# Patient Record
Sex: Female | Born: 1985 | Race: Black or African American | Hispanic: No | Marital: Single | Smoking: Never smoker
Health system: Southern US, Community
[De-identification: ages and names within clinical notes are randomized; demographics above are authoritative.]

## PROBLEM LIST (undated history)

## (undated) DIAGNOSIS — E119 Type 2 diabetes mellitus without complications: Secondary | ICD-10-CM

## (undated) DIAGNOSIS — K219 Gastro-esophageal reflux disease without esophagitis: Secondary | ICD-10-CM

## (undated) DIAGNOSIS — E7800 Pure hypercholesterolemia, unspecified: Secondary | ICD-10-CM

## (undated) DIAGNOSIS — D649 Anemia, unspecified: Secondary | ICD-10-CM

## (undated) HISTORY — PX: LAPAROSCOPY: SHX197

---

## 2009-04-05 ENCOUNTER — Emergency Department (HOSPITAL_BASED_OUTPATIENT_CLINIC_OR_DEPARTMENT_OTHER): Admission: EM | Admit: 2009-04-05 | Discharge: 2009-04-05 | Payer: Self-pay | Admitting: Emergency Medicine

## 2010-02-24 ENCOUNTER — Emergency Department (HOSPITAL_BASED_OUTPATIENT_CLINIC_OR_DEPARTMENT_OTHER): Admission: EM | Admit: 2010-02-24 | Discharge: 2010-02-24 | Payer: Self-pay | Admitting: Emergency Medicine

## 2010-05-02 ENCOUNTER — Emergency Department (HOSPITAL_BASED_OUTPATIENT_CLINIC_OR_DEPARTMENT_OTHER): Admission: EM | Admit: 2010-05-02 | Discharge: 2010-05-02 | Payer: Self-pay | Admitting: Emergency Medicine

## 2011-03-22 LAB — RAPID STREP SCREEN (MED CTR MEBANE ONLY): Streptococcus, Group A Screen (Direct): NEGATIVE

## 2012-11-30 ENCOUNTER — Emergency Department (HOSPITAL_BASED_OUTPATIENT_CLINIC_OR_DEPARTMENT_OTHER)
Admission: EM | Admit: 2012-11-30 | Discharge: 2012-11-30 | Disposition: A | Discharge status: Home / Self Care | Payer: Medicaid Other | Attending: Emergency Medicine | Admitting: Emergency Medicine

## 2012-11-30 ENCOUNTER — Encounter (HOSPITAL_BASED_OUTPATIENT_CLINIC_OR_DEPARTMENT_OTHER): Payer: Self-pay | Admitting: Emergency Medicine

## 2012-11-30 DIAGNOSIS — R059 Cough, unspecified: Secondary | ICD-10-CM | POA: Insufficient documentation

## 2012-11-30 DIAGNOSIS — IMO0001 Reserved for inherently not codable concepts without codable children: Secondary | ICD-10-CM | POA: Insufficient documentation

## 2012-11-30 DIAGNOSIS — J988 Other specified respiratory disorders: Secondary | ICD-10-CM

## 2012-11-30 DIAGNOSIS — R6889 Other general symptoms and signs: Secondary | ICD-10-CM | POA: Insufficient documentation

## 2012-11-30 DIAGNOSIS — R509 Fever, unspecified: Secondary | ICD-10-CM | POA: Insufficient documentation

## 2012-11-30 DIAGNOSIS — R05 Cough: Secondary | ICD-10-CM | POA: Insufficient documentation

## 2012-11-30 DIAGNOSIS — B9789 Other viral agents as the cause of diseases classified elsewhere: Secondary | ICD-10-CM | POA: Insufficient documentation

## 2012-11-30 NOTE — ED Notes (Signed)
Runny nose, nasal and chest congestion, cough, fever, headache, chills, x one week.    No N/V/D.  No dysuria.

## 2012-11-30 NOTE — ED Notes (Signed)
The patient is undressed and in a gown. The bed is locked and in the lowest position. The call light is within reach, and family is at the bedside. A warm blanket has been given.

## 2012-11-30 NOTE — ED Provider Notes (Signed)
History     CSN: [id number]  Arrival date & time 11/30/12  1031   First MD Initiated Contact with Patient 11/30/12 1136      Chief Complaint  Patient presents with  . Nasal Congestion  . Cough  . Fever    (Consider location/radiation/quality/duration/timing/severity/associated sxs/prior treatment) HPI Complains of cough, sneeze, nasal congestion and diffuse myalgias for 3 days. Admits to mild headache. Treated with Tylenol and over-the-counter cold and sinus medicine without relief presently she feels mildly achy however states "I don't feel that bad" nothing makes symptoms better or worse no other associated symptoms. History reviewed. No pertinent past medical history. Past medical history negative History reviewed. No pertinent past surgical history. 6 History reviewed. No pertinent family history.  History  Substance Use Topics  . Smoking status: Not on file  . Smokeless tobacco: Not on file  . Alcohol Use: Not on file    OB History    Grav Para Term Preterm Abortions TAB SAB Ect Mult Living                  Review of Systems  Constitutional: Positive for fever and chills.  HENT: Positive for congestion and sneezing.   Respiratory: Positive for cough.   Musculoskeletal: Positive for myalgias.  All other systems reviewed and are negative.    Allergies  Review of patient's allergies indicates no known allergies.  Home Medications  No current outpatient prescriptions on file.  BP 132/72  Pulse 85  Temp 98.7 F (37.1 C) (Oral)  Resp 16  SpO2 100%  LMP 11/04/2012  Physical Exam  Nursing note and vitals reviewed. Constitutional: She appears well-developed and well-nourished.  HENT:  Head: Normocephalic and atraumatic.  Mouth/Throat: Oropharynx is clear and moist.       Dentition poor  Eyes: Conjunctivae normal are normal. Pupils are equal, round, and reactive to light.  Neck: Neck supple. No tracheal deviation present. No thyromegaly present.   Cardiovascular: Normal rate and regular rhythm.   No murmur heard. Pulmonary/Chest: Effort normal and breath sounds normal.  Abdominal: Soft. Bowel sounds are normal. She exhibits no distension. There is no tenderness.       Obese  Musculoskeletal: Normal range of motion. She exhibits no edema and no tenderness.  Neurological: She is alert. Coordination normal.  Skin: Skin is warm and dry. No rash noted.  Psychiatric: She has a normal mood and affect.    ED Course  Procedures (including critical care time)  Labs Reviewed - No data to display No results found.   No diagnosis found.    MDM  Symptoms and exam consistent with viral respiratory illness Plan Tylenol for aches or fever Encourage oral hydration To see PMD if not better in a week Diagnosis viral respiratory illness        Doug Sou, MD 11/30/12 1200

## 2012-12-16 ENCOUNTER — Emergency Department (HOSPITAL_BASED_OUTPATIENT_CLINIC_OR_DEPARTMENT_OTHER)
Admission: EM | Admit: 2012-12-16 | Discharge: 2012-12-17 | Disposition: A | Discharge status: Home / Self Care | Payer: Medicaid Other | Attending: Emergency Medicine | Admitting: Emergency Medicine

## 2012-12-16 ENCOUNTER — Encounter (HOSPITAL_BASED_OUTPATIENT_CLINIC_OR_DEPARTMENT_OTHER): Payer: Self-pay | Admitting: *Deleted

## 2012-12-16 DIAGNOSIS — R220 Localized swelling, mass and lump, head: Secondary | ICD-10-CM | POA: Insufficient documentation

## 2012-12-16 DIAGNOSIS — K089 Disorder of teeth and supporting structures, unspecified: Secondary | ICD-10-CM | POA: Insufficient documentation

## 2012-12-16 DIAGNOSIS — Z3202 Encounter for pregnancy test, result negative: Secondary | ICD-10-CM | POA: Insufficient documentation

## 2012-12-16 DIAGNOSIS — IMO0002 Reserved for concepts with insufficient information to code with codable children: Secondary | ICD-10-CM | POA: Insufficient documentation

## 2012-12-16 DIAGNOSIS — K0889 Other specified disorders of teeth and supporting structures: Secondary | ICD-10-CM

## 2012-12-16 DIAGNOSIS — M545 Low back pain: Secondary | ICD-10-CM

## 2012-12-16 LAB — URINALYSIS, ROUTINE W REFLEX MICROSCOPIC
Bilirubin Urine: NEGATIVE
Glucose, UA: NEGATIVE mg/dL
Ketones, ur: NEGATIVE mg/dL
Protein, ur: NEGATIVE mg/dL

## 2012-12-16 LAB — URINE MICROSCOPIC-ADD ON

## 2012-12-16 NOTE — ED Notes (Signed)
States she has had a dental abscess x 6 months.

## 2012-12-16 NOTE — ED Notes (Signed)
Pt also c/o dental pain R Lower molar area pain 7/10

## 2012-12-16 NOTE — ED Notes (Signed)
Pt requesting to be checked for uti, states that she is having lower back pain for several days

## 2012-12-16 NOTE — ED Provider Notes (Signed)
History   This chart was scribed for Hurman Horn, MD by Leone Payor, ED Scribe. This patient was seen in room MHOTF/OTF and the patient's care was started at 2243.   CSN: [id number]  Arrival date & time 12/16/12  2144   First MD Initiated Contact with Patient 12/16/12 2243      Chief Complaint  Patient presents with  . Dental Problem     The history is provided by the patient. No language interpreter was used.    Selena Boyd is a 27 y.o. female who presents to the Emergency Department complaining of constant, gradually worsening, moderate, chronic dental abscess with associated starting 6 months ago. Pt reports sometimes taking tylenol with mild relief but she has not taken any OTC medication today.  Pt denies seeing a dentist in "years".   Pt also complains of having constant, unchanged, moderate lower back pain that radiates down both legs below the knee for 1 week. States pain is worse with movement and urinating and is relieved with rest. She denies fever, weakness or numbness in extremities, rash, change in bowel or bladder function, IV drug use, vaginal discharge, abdominal pain, nausea, vomiting, diarrhea   Pt denies smoking and alcohol use.  History reviewed. No pertinent past medical history.  History reviewed. No pertinent past surgical history.  No family history on file.  History  Substance Use Topics  . Smoking status: Never Smoker   . Smokeless tobacco: Not on file  . Alcohol Use: No    No OB history provided.   Review of Systems  10 Systems reviewed and all are negative for acute change except as noted in the HPI.    Allergies  Review of patient's allergies indicates no known allergies.  Home Medications   Current Outpatient Rx  Name  Route  Sig  Dispense  Refill  . HYDROCODONE-ACETAMINOPHEN 5-325 MG PO TABS   Oral   Take 2 tablets by mouth every 6 (six) hours as needed for pain.   20 tablet   0   . PENICILLIN V POTASSIUM 500 MG PO TABS  Oral   Take 2 tablets (1,000 mg total) by mouth 2 (two) times daily. X 7 days   28 tablet   0     BP 156/95  Pulse 106  Temp 98.1 F (36.7 C) (Oral)  Resp 22  SpO2 100%  LMP 11/04/2012  Physical Exam  Nursing note and vitals reviewed. Constitutional:       Awake, alert, nontoxic appearance with baseline speech.  HENT:  Head: Atraumatic.       Teeth are non tender.  Gingiva on buccal aspect of tooth number 30 has tiny pustule with scant bleeding and minimal pus drainage.   Eyes: Pupils are equal, round, and reactive to light. Right eye exhibits no discharge. Left eye exhibits no discharge.  Neck: Neck supple.  Cardiovascular: Normal rate and regular rhythm.   No murmur heard. Pulmonary/Chest: Effort normal and breath sounds normal. No respiratory distress. She has no wheezes. She has no rales. She exhibits no tenderness.  Abdominal: Soft. Bowel sounds are normal. She exhibits no mass. There is no tenderness. There is no rebound.  Musculoskeletal:       Thoracic back: She exhibits no tenderness.       Lumbar back: She exhibits no tenderness.       Bilateral lower extremities non tender without new rashes or color change, baseline ROM with intact DP pulses, CR<2 secs all digits bilaterally,  sensation baseline light touch bilaterally for pt, DTR's symmetric and intact bilaterally KJ / AJ, motor symmetric bilateral 5 / 5 hip flexion, quadriceps, hamstrings, EHL, foot dorsiflexion, foot plantarflexion, gait somewhat antalgic but without apparent new ataxia.  No midline back tenderness. Positive bilateral para-lumbar tenderness. No CVA tenderness.   Neurological:       Mental status baseline for patient.  Upper extremity motor strength and sensation intact and symmetric bilaterally.  Skin: No rash noted.  Psychiatric: She has a normal mood and affect.    ED Course  Procedures (including critical care time)  DIAGNOSTIC STUDIES: Oxygen Saturation is 100% on room air, normal by my  interpretation.    COORDINATION OF CARE:  11:03PM Patient / Family / Caregiver informed of clinical course, understand medical decision-making process, and agree with plan.   Labs Reviewed  URINALYSIS, ROUTINE W REFLEX MICROSCOPIC - Abnormal; Notable for the following:    APPearance CLOUDY (*)     Hgb urine dipstick LARGE (*)     Leukocytes, UA TRACE (*)     All other components within normal limits  URINE MICROSCOPIC-ADD ON - Abnormal; Notable for the following:    Squamous Epithelial / LPF FEW (*)     All other components within normal limits  PREGNANCY, URINE   No results found.   1. Pain, dental   2. Low back pain       MDM  I personally performed the services described in this documentation, which was scribed in my presence. The recorded information has been reviewed and is accurate.  Patient / Family / Caregiver informed of clinical course, understand medical decision-making process, and agree with plan.  I doubt any other EMC precluding discharge at this time including, but not necessarily limited to the following: SBI, cauda equina.  Hurman Horn, MD 12/17/12 [phone]

## 2012-12-17 MED ORDER — HYDROCODONE-ACETAMINOPHEN 5-325 MG PO TABS: 2.0000 | ORAL_TABLET | Freq: Four times a day (QID) | ORAL | Status: DC | PRN

## 2012-12-17 MED ORDER — PENICILLIN V POTASSIUM 500 MG PO TABS: 1000.0000 mg | ORAL_TABLET | Freq: Two times a day (BID) | ORAL | Status: DC

## 2013-05-15 ENCOUNTER — Encounter (HOSPITAL_BASED_OUTPATIENT_CLINIC_OR_DEPARTMENT_OTHER): Payer: Self-pay | Admitting: *Deleted

## 2013-05-15 ENCOUNTER — Emergency Department (HOSPITAL_BASED_OUTPATIENT_CLINIC_OR_DEPARTMENT_OTHER)
Admission: EM | Admit: 2013-05-15 | Discharge: 2013-05-15 | Disposition: A | Discharge status: Home / Self Care | Payer: No Typology Code available for payment source | Attending: Emergency Medicine | Admitting: Emergency Medicine

## 2013-05-15 DIAGNOSIS — Y92410 Unspecified street and highway as the place of occurrence of the external cause: Secondary | ICD-10-CM | POA: Insufficient documentation

## 2013-05-15 DIAGNOSIS — S199XXA Unspecified injury of neck, initial encounter: Secondary | ICD-10-CM | POA: Insufficient documentation

## 2013-05-15 DIAGNOSIS — S0993XA Unspecified injury of face, initial encounter: Secondary | ICD-10-CM | POA: Insufficient documentation

## 2013-05-15 DIAGNOSIS — T148XXA Other injury of unspecified body region, initial encounter: Secondary | ICD-10-CM

## 2013-05-15 DIAGNOSIS — Y9389 Activity, other specified: Secondary | ICD-10-CM | POA: Insufficient documentation

## 2013-05-15 DIAGNOSIS — IMO0002 Reserved for concepts with insufficient information to code with codable children: Secondary | ICD-10-CM | POA: Insufficient documentation

## 2013-05-15 DIAGNOSIS — T07XXXA Unspecified multiple injuries, initial encounter: Secondary | ICD-10-CM | POA: Insufficient documentation

## 2013-05-15 MED ORDER — METHOCARBAMOL 500 MG PO TABS: 500.0000 mg | ORAL_TABLET | Freq: Two times a day (BID) | ORAL | Status: DC

## 2013-05-15 MED ORDER — IBUPROFEN 400 MG PO TABS
600.0000 mg | ORAL_TABLET | Freq: Once | ORAL | Status: AC
  Administered 2013-05-15: 600 mg via ORAL
  Filled 2013-05-15: qty 1

## 2013-05-15 MED ORDER — IBUPROFEN 600 MG PO TABS: 600.0000 mg | ORAL_TABLET | Freq: Four times a day (QID) | ORAL | Status: DC | PRN
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
## 2013-06-06 ENCOUNTER — Encounter (HOSPITAL_BASED_OUTPATIENT_CLINIC_OR_DEPARTMENT_OTHER): Payer: Self-pay | Admitting: *Deleted

## 2013-06-06 ENCOUNTER — Emergency Department (HOSPITAL_BASED_OUTPATIENT_CLINIC_OR_DEPARTMENT_OTHER): Payer: Medicaid Other

## 2013-06-06 ENCOUNTER — Emergency Department (HOSPITAL_BASED_OUTPATIENT_CLINIC_OR_DEPARTMENT_OTHER)
Admission: EM | Admit: 2013-06-06 | Discharge: 2013-06-06 | Disposition: A | Discharge status: Home / Self Care | Payer: Medicaid Other | Attending: Emergency Medicine | Admitting: Emergency Medicine

## 2013-06-06 DIAGNOSIS — K219 Gastro-esophageal reflux disease without esophagitis: Secondary | ICD-10-CM | POA: Insufficient documentation

## 2013-06-06 DIAGNOSIS — Z3202 Encounter for pregnancy test, result negative: Secondary | ICD-10-CM | POA: Insufficient documentation

## 2013-06-06 LAB — BASIC METABOLIC PANEL
CO2: 23 mEq/L (ref 19–32)
Calcium: 9.6 mg/dL (ref 8.4–10.5)
Creatinine, Ser: 0.9 mg/dL (ref 0.50–1.10)
GFR calc Af Amer: >90 mL/min (ref >90)
GFR calc non Af Amer: 87 mL/min — ABNORMAL LOW (ref >90)
Sodium: 138 mEq/L (ref 135–145)

## 2013-06-06 LAB — URINALYSIS, ROUTINE W REFLEX MICROSCOPIC
Hgb urine dipstick: NEGATIVE
Leukocytes, UA: NEGATIVE
Nitrite: NEGATIVE
Protein, ur: NEGATIVE mg/dL
Specific Gravity, Urine: 1.021 (ref 1.005–1.030)
Urobilinogen, UA: 1 mg/dL (ref 0.0–1.0)

## 2013-06-06 LAB — PREGNANCY, URINE: Preg Test, Ur: NEGATIVE

## 2013-06-06 MED ORDER — RANITIDINE HCL 150 MG PO CAPS: 150.0000 mg | ORAL_CAPSULE | Freq: Every day | ORAL | Status: DC

## 2013-06-06 MED ORDER — GI COCKTAIL ~~LOC~~
30.0000 mL | Freq: Once | ORAL | Status: AC
  Administered 2013-06-06: 30 mL via ORAL
  Filled 2013-06-06: qty 30

## 2013-06-06 NOTE — ED Notes (Signed)
Pt c/o increased BP and " little bit of chest pain" pt also c/o freq painful urination , vaginal itching denies discharge

## 2013-06-06 NOTE — Discharge Instructions (Signed)
Diet for Gastroesophageal Reflux Disease, Adult  Reflux is when stomach acid flows up into the esophagus. The esophagus becomes irritated and sore (inflammation). When reflux happens often and is severe, it is called gastroesophageal reflux disease (GERD). What you eat can help ease any discomfort caused by GERD.  FOODS OR DRINKS TO AVOID OR LIMIT   Coffee and black tea, with or without caffeine.   Bubbly (carbonated) drinks with caffeine or energy drinks.   Strong spices, such as pepper, cayenne pepper, curry, or chili powder.   Peppermint or spearmint.   Chocolate.   High-fat foods, such as meats, fried food, oils, butter, or nuts.   Fruits and vegetables that cause discomfort. This includes citrus fruits and tomatoes.   Alcohol.  If a certain food or drink irritates your GERD, avoid eating or drinking it.  THINGS THAT MAY HELP GERD INCLUDE:   Eat meals slowly.   Eat 5 to 6 small meals a day, not 3 large meals.   Do not eat food for a certain amount of time if it causes discomfort.   Wait 3 hours after eating before lying down.   Keep the head of your bed raised 6 to 9 inches (15 23 centimeters). Put a foam wedge or blocks under the legs of the bed.   Stay active. Weight loss, if needed, may help ease your discomfort.   Wear loose-fitting clothing.   Do not smoke or chew tobacco.  Document Released: 05/28/2012 Document Reviewed: 05/28/2012  [employer] Patient Information 2014 [employer]

## 2013-06-06 NOTE — ED Provider Notes (Signed)
History    CSN: [id number] Arrival date & time 06/06/13  1946  First MD Initiated Contact with Patient 06/06/13 2002     Chief Complaint  Patient presents with  . Hypertension   (Consider location/radiation/quality/duration/timing/severity/associated sxs/prior Treatment) HPI Comments: Pt states that she has increased bp and chest pain today:pt states that the pain in left side of chest and worse with movement:denies sob:pt states that she is also having pain with urination:denies vaginal discharge, n/v/d  The history is provided by the patient. No language interpreter was used.   History reviewed. No pertinent past medical history. Past Surgical History  Procedure Laterality Date  . Cesarean section    . Cesarean section     History reviewed. No pertinent family history. History  Substance Use Topics  . Smoking status: Never Smoker   . Smokeless tobacco: Not on file  . Alcohol Use: No   OB History   Grav Para Term Preterm Abortions TAB SAB Ect Mult Living                 Review of Systems  Constitutional: Negative.   Respiratory: Negative.   Cardiovascular: Negative.     Allergies  Review of patient's allergies indicates no known allergies.  Home Medications   Current Outpatient Rx  Name  Route  Sig  Dispense  Refill  . HYDROcodone-acetaminophen (NORCO) 5-325 MG per tablet   Oral   Take 2 tablets by mouth every 6 (six) hours as needed for pain.   20 tablet   0   . ibuprofen (ADVIL,MOTRIN) 600 MG tablet   Oral   Take 1 tablet (600 mg total) by mouth every 6 (six) hours as needed for pain.   30 tablet   0   . methocarbamol (ROBAXIN) 500 MG tablet   Oral   Take 1 tablet (500 mg total) by mouth 2 (two) times daily.   20 tablet   0   . penicillin v potassium (VEETID) 500 MG tablet   Oral   Take 2 tablets (1,000 mg total) by mouth 2 (two) times daily. X 7 days   28 tablet   0    BP 153/101  Pulse 87  Temp(Src) 98.4 F (36.9 C) (Oral)  Resp 16   Ht 5\' 6"  (1.676 m)  Wt 264 lb (119.75 kg)  BMI 42.63 kg/m2  SpO2 100%  LMP 05/19/2013 Physical Exam  Nursing note and vitals reviewed. Constitutional: She is oriented to person, place, and time. She appears well-developed and well-nourished.  HENT:  Head: Normocephalic and atraumatic.  Cardiovascular: Normal rate and regular rhythm.   Pulmonary/Chest: Effort normal and breath sounds normal.  Reproducible pain on left chest wall  Abdominal: Soft. Bowel sounds are normal. There is no tenderness.  Musculoskeletal: Normal range of motion.  Neurological: She is alert and oriented to person, place, and time.  Skin: Skin is warm and dry.  Psychiatric: She has a normal mood and affect.    ED Course  Procedures (including critical care time) Labs Reviewed  URINALYSIS, ROUTINE W REFLEX MICROSCOPIC - Abnormal; Notable for the following:    APPearance CLOUDY (*)    All other components within normal limits  BASIC METABOLIC PANEL - Abnormal; Notable for the following:    GFR calc non Af Amer 87 (*)    All other components within normal limits  PREGNANCY, URINE    Date: 06/06/2013  Rate:88  Rhythm: normal sinus rhythm and sinus arrhythmia  QRS Axis: normal  Intervals: normal  ST/T Wave abnormalities: normal  Conduction Disutrbances:none  Narrative Interpretation:   Old EKG Reviewed: none available   Dg Chest 2 View  06/06/2013   *RADIOLOGY REPORT*  Clinical Data: Left-sided chest pain  CHEST - 2 VIEW  Comparison: None.  Findings: Normal cardiac silhouette and mediastinal contours.  No focal parenchymal opacities.  No pleural effusion or pneumothorax. No evidence of edema.  No acute osseous abnormalities.  IMPRESSION: No acute cardiopulmonary disease.   Original Report Authenticated By: Tacey Ruiz, MD   1. GERD (gastroesophageal reflux disease)     MDM  Pt feels better after gi cocktail:no infection noted in urine:pt is okay to follow up with pcp for continued symptoms;discussed  having bp rechecked  Teressa Lower, NP 06/06/13 2148

## 2013-06-07 NOTE — ED Provider Notes (Signed)
History/physical exam/procedure(s) were performed by non-physician practitioner and as supervising physician I was immediately available for consultation/collaboration. I have reviewed all notes and am in agreement with care and plan.   Hilario Quarry, MD 06/07/13 1246

## 2013-06-24 ENCOUNTER — Encounter (HOSPITAL_BASED_OUTPATIENT_CLINIC_OR_DEPARTMENT_OTHER): Payer: Self-pay

## 2013-06-24 ENCOUNTER — Emergency Department (HOSPITAL_BASED_OUTPATIENT_CLINIC_OR_DEPARTMENT_OTHER)
Admission: EM | Admit: 2013-06-24 | Discharge: 2013-06-24 | Disposition: A | Discharge status: Home / Self Care | Payer: Medicaid Other | Attending: Emergency Medicine | Admitting: Emergency Medicine

## 2013-06-24 DIAGNOSIS — N912 Amenorrhea, unspecified: Secondary | ICD-10-CM

## 2013-06-24 LAB — URINALYSIS, ROUTINE W REFLEX MICROSCOPIC
Bilirubin Urine: NEGATIVE
Glucose, UA: NEGATIVE mg/dL
Hgb urine dipstick: NEGATIVE
Ketones, ur: NEGATIVE mg/dL
Leukocytes, UA: NEGATIVE
Nitrite: NEGATIVE
Protein, ur: NEGATIVE mg/dL
Specific Gravity, Urine: 1.031 — ABNORMAL HIGH (ref 1.005–1.030)
Urobilinogen, UA: 1 mg/dL (ref 0.0–1.0)
pH: 5.5 (ref 5.0–8.0)

## 2013-06-24 LAB — PREGNANCY, URINE: Preg Test, Ur: NEGATIVE

## 2013-06-24 NOTE — ED Provider Notes (Signed)
Medical screening examination/treatment/procedure(s) were performed by non-physician practitioner and as supervising physician I was immediately available for consultation/collaboration.  Bahja Bence, MD 06/24/13 1515 

## 2013-06-24 NOTE — ED Notes (Signed)
Reports reports missed period-LMP 6/5-reports "about 5" negative home preg test-denies pain

## 2013-06-24 NOTE — ED Provider Notes (Signed)
History    CSN: [id number] Arrival date & time 06/24/13  1152  First MD Initiated Contact with Patient 06/24/13 1231     Chief Complaint  Patient presents with  . Amenorrhea   (Consider location/radiation/quality/duration/timing/severity/associated sxs/prior Treatment) HPI Comments: 27 y.o. Female with no significant medical hx presents today complaining of amenorrhea. Onset 05/15/13 LMP. Pt thought she might be pregnant, took 5 pregnancy tests which were all negative.  Pt states she is normally very regular with her cycle and is not sure why she is "late." Pt is not on any form of birth control.  Pt denies recent fevers, abdominal pain, vaginal discharge, pelvic pain, dysuria, hematuria. Pt states she had an appointment with OB today, but "doesn't really like him," so thought she would come here.   History reviewed. No pertinent past medical history. Past Surgical History  Procedure Laterality Date  . Cesarean section    . Cesarean section     No family history on file. History  Substance Use Topics  . Smoking status: Never Smoker   . Smokeless tobacco: Not on file  . Alcohol Use: No   OB History   Grav Para Term Preterm Abortions TAB SAB Ect Mult Living                 Review of Systems  Constitutional: Negative for fever and diaphoresis.  HENT: Negative for neck pain and neck stiffness.   Eyes: Negative for visual disturbance.  Respiratory: Negative for apnea, chest tightness and shortness of breath.   Cardiovascular: Negative for chest pain and palpitations.  Gastrointestinal: Negative for nausea, vomiting, diarrhea and constipation.  Genitourinary: Positive for menstrual problem. Negative for dysuria, vaginal bleeding, vaginal discharge and pelvic pain.       LMP 05/15/13  Musculoskeletal: Negative for gait problem.  Skin: Negative for rash.  Neurological: Negative for dizziness, weakness, light-headedness, numbness and headaches.    Allergies  Review of patient's  allergies indicates no known allergies.  Home Medications   Current Outpatient Rx  Name  Route  Sig  Dispense  Refill  . HYDROcodone-acetaminophen (NORCO) 5-325 MG per tablet   Oral   Take 2 tablets by mouth every 6 (six) hours as needed for pain.   20 tablet   0   . ibuprofen (ADVIL,MOTRIN) 600 MG tablet   Oral   Take 1 tablet (600 mg total) by mouth every 6 (six) hours as needed for pain.   30 tablet   0   . methocarbamol (ROBAXIN) 500 MG tablet   Oral   Take 1 tablet (500 mg total) by mouth 2 (two) times daily.   20 tablet   0   . penicillin v potassium (VEETID) 500 MG tablet   Oral   Take 2 tablets (1,000 mg total) by mouth 2 (two) times daily. X 7 days   28 tablet   0   . ranitidine (ZANTAC) 150 MG capsule   Oral   Take 1 capsule (150 mg total) by mouth daily.   30 capsule   0    BP 123/97  Pulse 89  Temp(Src) 98.6 F (37 C) (Oral)  Resp 18  Ht 5\' 6"  (1.676 m)  Wt 264 lb (119.75 kg)  BMI 42.63 kg/m2  SpO2 99%  LMP 05/15/2013 Physical Exam  Nursing note and vitals reviewed. Constitutional: She is oriented to person, place, and time. She appears well-developed and well-nourished. No distress.  HENT:  Head: Normocephalic and atraumatic.  Eyes: Conjunctivae and  EOM are normal.  Neck: Normal range of motion. Neck supple.  No meningeal signs  Cardiovascular: Normal rate, regular rhythm and normal heart sounds.  Exam reveals no gallop and no friction rub.   No murmur heard. Pulmonary/Chest: Effort normal and breath sounds normal. No respiratory distress. She has no wheezes. She has no rales. She exhibits no tenderness.  Abdominal: Soft. Bowel sounds are normal. She exhibits no distension. There is no tenderness. There is no rebound and no guarding.  Musculoskeletal: Normal range of motion. She exhibits no edema and no tenderness.  Neurological: She is alert and oriented to person, place, and time. No cranial nerve deficit.  Skin: Skin is warm and dry. She is  not diaphoretic. No erythema.  Psychiatric: She has a normal mood and affect.    ED Course  Procedures (including critical care time) Labs Reviewed  URINALYSIS, ROUTINE W REFLEX MICROSCOPIC - Abnormal; Notable for the following:    Specific Gravity, Urine 1.031 (*)    All other components within normal limits  PREGNANCY, URINE   No results found. 1. Amenorrhea     MDM  Pt is asymptomatic for anything acute or emergent. Discussed with pt that this diagnostic is more appropriate in an outpt setting.  At this time there does not appear to be any evidence of an acute emergency medical condition and the patient appears stable for discharge with appropriate outpatient follow up.Diagnosis was discussed with patient who verbalizes understanding and is agreeable to discharge. Pt case discussed with Dr. Juleen China who agrees with my plan.    Glade Nurse, PA-C 06/24/13 1330

## 2013-07-20 ENCOUNTER — Emergency Department (HOSPITAL_BASED_OUTPATIENT_CLINIC_OR_DEPARTMENT_OTHER)
Admission: EM | Admit: 2013-07-20 | Discharge: 2013-07-20 | Disposition: A | Discharge status: Home / Self Care | Payer: Medicaid Other | Attending: Emergency Medicine | Admitting: Emergency Medicine

## 2013-07-20 ENCOUNTER — Emergency Department (HOSPITAL_BASED_OUTPATIENT_CLINIC_OR_DEPARTMENT_OTHER): Payer: Medicaid Other

## 2013-07-20 ENCOUNTER — Encounter (HOSPITAL_BASED_OUTPATIENT_CLINIC_OR_DEPARTMENT_OTHER): Payer: Self-pay | Admitting: Emergency Medicine

## 2013-07-20 DIAGNOSIS — A599 Trichomoniasis, unspecified: Secondary | ICD-10-CM | POA: Insufficient documentation

## 2013-07-20 DIAGNOSIS — M545 Low back pain, unspecified: Secondary | ICD-10-CM | POA: Insufficient documentation

## 2013-07-20 DIAGNOSIS — R51 Headache: Secondary | ICD-10-CM | POA: Insufficient documentation

## 2013-07-20 DIAGNOSIS — Z3202 Encounter for pregnancy test, result negative: Secondary | ICD-10-CM | POA: Insufficient documentation

## 2013-07-20 DIAGNOSIS — R6883 Chills (without fever): Secondary | ICD-10-CM | POA: Insufficient documentation

## 2013-07-20 DIAGNOSIS — N912 Amenorrhea, unspecified: Secondary | ICD-10-CM | POA: Insufficient documentation

## 2013-07-20 DIAGNOSIS — R319 Hematuria, unspecified: Secondary | ICD-10-CM | POA: Insufficient documentation

## 2013-07-20 DIAGNOSIS — R109 Unspecified abdominal pain: Secondary | ICD-10-CM | POA: Insufficient documentation

## 2013-07-20 DIAGNOSIS — N390 Urinary tract infection, site not specified: Secondary | ICD-10-CM | POA: Insufficient documentation

## 2013-07-20 LAB — WET PREP, GENITAL: Yeast Wet Prep HPF POC: NONE SEEN

## 2013-07-20 LAB — URINALYSIS, ROUTINE W REFLEX MICROSCOPIC
Bilirubin Urine: NEGATIVE
Ketones, ur: NEGATIVE mg/dL
Nitrite: NEGATIVE
Specific Gravity, Urine: 1.024 (ref 1.005–1.030)
Urobilinogen, UA: 1 mg/dL (ref 0.0–1.0)

## 2013-07-20 LAB — URINE MICROSCOPIC-ADD ON

## 2013-07-20 MED ORDER — CEFTRIAXONE SODIUM 1 G IJ SOLR
1.0000 g | Freq: Once | INTRAMUSCULAR | Status: AC
  Administered 2013-07-20: 1 g via INTRAMUSCULAR
  Filled 2013-07-20: qty 10

## 2013-07-20 MED ORDER — AZITHROMYCIN 1 G PO PACK
1.0000 g | PACK | Freq: Once | ORAL | Status: AC
  Administered 2013-07-20: 1 g via ORAL
  Filled 2013-07-20: qty 1

## 2013-07-20 MED ORDER — IBUPROFEN 800 MG PO TABS
800.0000 mg | ORAL_TABLET | Freq: Once | ORAL | Status: AC
  Administered 2013-07-20: 800 mg via ORAL
  Filled 2013-07-20: qty 1

## 2013-07-20 MED ORDER — METRONIDAZOLE 500 MG PO TABS: 500.0000 mg | ORAL_TABLET | Freq: Two times a day (BID) | ORAL | Status: DC

## 2013-07-20 MED ORDER — SULFAMETHOXAZOLE-TRIMETHOPRIM 800-160 MG PO TABS: 1.0000 | ORAL_TABLET | Freq: Two times a day (BID) | ORAL | Status: DC

## 2013-07-20 MED ORDER — LIDOCAINE HCL (PF) 1 % IJ SOLN
INTRAMUSCULAR | Status: AC
  Administered 2013-07-20: 2.1 mL
  Filled 2013-07-20: qty 5

## 2013-07-20 NOTE — ED Notes (Signed)
Right flank pain intermittently x2 months.  Worse x1 week.  Noticed blood in urine. Last period June 5th. Usually has regular periods.  Sts home preg test was neg.

## 2013-07-20 NOTE — ED Provider Notes (Signed)
CSN: [id number]     Arrival date & time 07/20/13  1634 History     First MD Initiated Contact with Patient 07/20/13 1749     Chief Complaint  Patient presents with  . Flank Pain  . Hematuria  . Menstrual Problem   (Consider location/radiation/quality/duration/timing/severity/associated sxs/prior Treatment) The history is provided by the patient. No language interpreter was used.  Selena Boyd is a 27 y/o F presenting to the ED with right sided flank that started approximately one week ago with hematuria that has been ongoing for the past 2 days. Patient reported that the right sided flank pain is an intermittent sharp shooting pain that gets worse when the patient is laying down with radiation to the RLQ. Patient reported that she has been experiencing lower back pain that is described asa aching sensation. Reported that she has used nothing for the pain. Stated that her LMP was 05/15/2013 - reported that she is sexually active, without using protection, and is not on birth control. Patient reported that she has been having problems withher period - amenorrhea - is due to see Dr. Okey Dupre, OBGYN, tomorrow. Denied vaginal bleeding, vaginal discharge, vaginal pain, nausea, vomiting, diarrhea, neck pain, chest pain, shortness of breath, difficulty breathing, dizziness. Denied history of kidney stones.   History reviewed. No pertinent past medical history. Past Surgical History  Procedure Laterality Date  . Cesarean section    . Laparoscopy     No family history on file. History  Substance Use Topics  . Smoking status: Never Smoker   . Smokeless tobacco: Not on file  . Alcohol Use: No   OB History   Grav Para Term Preterm Abortions TAB SAB Ect Mult Living                 Review of Systems  Constitutional: Positive for chills. Negative for fever.  HENT: Negative for neck pain and neck stiffness.   Eyes: Negative for visual disturbance.  Respiratory: Negative for cough, chest  tightness and shortness of breath.   Cardiovascular: Negative for chest pain.  Gastrointestinal: Negative for nausea, vomiting, abdominal pain and diarrhea.  Genitourinary: Positive for hematuria and flank pain.  Musculoskeletal: Positive for back pain.  Neurological: Positive for headaches. Negative for dizziness.  All other systems reviewed and are negative.    Allergies  Review of patient's allergies indicates no known allergies.  Home Medications   Current Outpatient Rx  Name  Route  Sig  Dispense  Refill  . ranitidine (ZANTAC) 150 MG capsule   Oral   Take 1 capsule (150 mg total) by mouth daily.   30 capsule   0   . HYDROcodone-acetaminophen (NORCO) 5-325 MG per tablet   Oral   Take 2 tablets by mouth every 6 (six) hours as needed for pain.   20 tablet   0   . ibuprofen (ADVIL,MOTRIN) 600 MG tablet   Oral   Take 1 tablet (600 mg total) by mouth every 6 (six) hours as needed for pain.   30 tablet   0   . methocarbamol (ROBAXIN) 500 MG tablet   Oral   Take 1 tablet (500 mg total) by mouth 2 (two) times daily.   20 tablet   0   . metroNIDAZOLE (FLAGYL) 500 MG tablet   Oral   Take 1 tablet (500 mg total) by mouth 2 (two) times daily. One po bid x 7 days   14 tablet   0   . penicillin v potassium (  VEETID) 500 MG tablet   Oral   Take 2 tablets (1,000 mg total) by mouth 2 (two) times daily. X 7 days   28 tablet   0   . sulfamethoxazole-trimethoprim (BACTRIM DS,SEPTRA DS) 800-160 MG per tablet   Oral   Take 1 tablet by mouth 2 (two) times daily. One po bid x 3 days   6 tablet   0    BP 132/81  Pulse 95  Temp(Src) 98.5 F (36.9 C) (Oral)  Resp 16  Ht 5\' 6"  (1.676 m)  Wt 267 lb (121.11 kg)  BMI 43.12 kg/m2  SpO2 99%  LMP 05/15/2013 Physical Exam  Nursing note and vitals reviewed. Constitutional: She is oriented to person, place, and time. She appears well-developed and well-nourished. No distress.  HENT:  Head: Normocephalic and atraumatic.  Neck:  Normal range of motion. Neck supple.  Cardiovascular: Normal rate, regular rhythm and normal heart sounds.  Exam reveals no friction rub.   No murmur heard. Pulses:      Radial pulses are 2+ on the right side, and 2+ on the left side.       Dorsalis pedis pulses are 2+ on the right side, and 2+ on the left side.  Pulmonary/Chest: Effort normal and breath sounds normal. No respiratory distress. She has no wheezes. She has no rales.  Abdominal: Soft. Bowel sounds are normal. She exhibits no distension. There is tenderness in the right lower quadrant and suprapubic area. There is CVA tenderness (right sided) and tenderness at McBurney's point. There is no rebound and no guarding.    Discomfort noted to the RLQ and suprapubic region the most tender upon palpation  Negative Obturator and Psoas Positive McBurney's sign Negative Rosving's   Genitourinary: No vaginal discharge found.  Pelvic: Negative inflammation, swelling, sores, lesions, erythema noted to external genitalia. Negative swelling, negative formation, negative edema, negative sores, lesion noted to the vaginal canal. Dark blood noted to the vaginal vault. Negative discharge noted. Negative inflammation swelling, strawberry appearance the cervix noted. Bilateral adnexal tenderness upon palpation. Negative CMT.  Musculoskeletal: Normal range of motion.  Lymphadenopathy:    She has no cervical adenopathy.  Neurological: She is alert and oriented to person, place, and time. She exhibits normal muscle tone. Coordination normal.  Skin: Skin is warm and dry. No rash noted. She is not diaphoretic. No erythema.  Psychiatric: She has a normal mood and affect. Her behavior is normal. Thought content normal.    ED Course   Procedures (including critical care time)  Medications  ibuprofen (ADVIL,MOTRIN) tablet 800 mg (800 mg Oral Given 07/20/13 1849)  cefTRIAXone (ROCEPHIN) injection 1 g (1 g Intramuscular Given 07/20/13 2153)  azithromycin  (ZITHROMAX) powder 1 g (1 g Oral Given 07/20/13 2152)  lidocaine (PF) (XYLOCAINE) 1 % injection (2.1 mLs  Given 07/20/13 2156)    Labs Reviewed  WET PREP, GENITAL - Abnormal; Notable for the following:    Trich, Wet Prep FEW (*)    Clue Cells Wet Prep HPF POC FEW (*)    WBC, Wet Prep HPF POC FEW (*)    All other components within normal limits  URINALYSIS, ROUTINE W REFLEX MICROSCOPIC - Abnormal; Notable for the following:    APPearance CLOUDY (*)    Hgb urine dipstick MODERATE (*)    All other components within normal limits  URINE MICROSCOPIC-ADD ON - Abnormal; Notable for the following:    Squamous Epithelial / LPF MANY (*)    Bacteria, UA MANY (*)  All other components within normal limits  GC/CHLAMYDIA PROBE AMP  PREGNANCY, URINE   Ct Abdomen Pelvis Wo Contrast  07/20/2013   *RADIOLOGY REPORT*  Clinical Data: Right flank pain worsening over the past week. Hematuria.  CT ABDOMEN AND PELVIS WITHOUT CONTRAST  Technique:  Multidetector CT imaging of the abdomen and pelvis was performed following the standard protocol without intravenous contrast.  Comparison: None.  Findings: Technical factors related to patient body habitus reduce diagnostic sensitivity and specificity.  Fullness of the pancreas is likely incidental.  Adrenal glands unremarkable.  Liver, spleen, and gallbladder unremarkable.  The kidneys appear unremarkable, as do the proximal ureters.  No hydroureter noted.  There are two calcifications in the right pelvis adjacent to the right distal ureter, but both are thought to likely be outside of the ureter.  Appendix appears unremarkable. No dilated bowel. The uterus and adnexa appear unremarkable.  Urinary bladder unremarkable.  Small inguinal lymph nodes are not pathologically enlarged by size criteria.  Similarly small bilateral pelvic sidewall lymph nodes are not pathologically enlarged.  IMPRESSION:  1.  A specific cause for the patient's intermittent right flank pain is not  identified.  There is no hydronephrosis or hydroureter, and the two punctate calcifications near the right distal ureter thought to be outside of the ureter compatible with vascular calcifications.   Original Report Authenticated By: Gaylyn Rong, M.D.   1. Trichomoniasis   2. UTI (urinary tract infection)   3. Flank pain     MDM  Patient presenting to the emergency department with right-sided flank pain that started one week ago and hematuria that started 2 days ago. Reported the LMP was 05/15/2013. Reported being sexually active, denied using protection. CVA tenderness noted to the right side upon palpation. Negative acute abdomen, negative peritoneal signs. Upon pelvic exam, blood noted in the vaginal vault, negative discharge noted. Bilateral adnexal tenderness upon palpation. Negative CMT tenderness. Negative urine pregnancy test. Urinalysis noted moderate blood in the urine, negative nitrates negative leukocytes. CT scan negative for nephrolithiasis and pyelonephritis. Wet prep noted few trichomonas cells, few clue cells, few elevated white blood cell count. Discussed labs and imaging results with patient in depth, answered all questions.  Patient treated prophylactically for gonorrhea and Chlamydia in the emergency department. Patient stable, afebrile. Negative signs of pyelonephritis and nephrolithiasis, negative signs of appendicitis. Suspicion for discomfort to be do to trichomoniasis infection. Discharged patient. Referred patient to OB/GYN, to keep appointment with Dr. Okey Dupre tomorrow. Discharged patient with antibiotics and anti-inflammatories. Discussed with patient to refrain from sexual activities. Referred patient to STD clinic for further workup. Discussed with patient to continue to rest and stay hydrated. Discussed with patient to continue to monitor symptoms and if symptoms are to worsen or change to report that the emergency department there strict return instructions  given. Patient agreed to plan of care, understood, all questions answered  Raymon Mutton, PA-C 07/21/13 1359

## 2013-07-20 NOTE — ED Notes (Signed)
PA at bedside giving detailed test results and plan of care for DC.

## 2013-07-22 LAB — GC/CHLAMYDIA PROBE AMP
CT Probe RNA: NEGATIVE
GC Probe RNA: NEGATIVE

## 2013-07-24 NOTE — ED Provider Notes (Signed)
Medical screening examination/treatment/procedure(s) were performed by non-physician practitioner and as supervising physician I was immediately available for consultation/collaboration.  Toy Baker, MD 07/24/13 [phone]

## 2013-12-18 ENCOUNTER — Emergency Department (HOSPITAL_BASED_OUTPATIENT_CLINIC_OR_DEPARTMENT_OTHER)
Admission: EM | Admit: 2013-12-18 | Discharge: 2013-12-18 | Disposition: A | Discharge status: Home / Self Care | Payer: Medicaid Other | Attending: Emergency Medicine | Admitting: Emergency Medicine

## 2013-12-18 ENCOUNTER — Encounter (HOSPITAL_BASED_OUTPATIENT_CLINIC_OR_DEPARTMENT_OTHER): Payer: Self-pay | Admitting: Emergency Medicine

## 2013-12-18 DIAGNOSIS — Z792 Long term (current) use of antibiotics: Secondary | ICD-10-CM | POA: Insufficient documentation

## 2013-12-18 DIAGNOSIS — Z79899 Other long term (current) drug therapy: Secondary | ICD-10-CM | POA: Insufficient documentation

## 2013-12-18 DIAGNOSIS — J069 Acute upper respiratory infection, unspecified: Secondary | ICD-10-CM | POA: Insufficient documentation

## 2013-12-18 NOTE — Discharge Instructions (Signed)

## 2013-12-18 NOTE — ED Notes (Signed)
Cough and sore throat x 1 week.  

## 2013-12-18 NOTE — ED Provider Notes (Signed)
CSN: [id number]     Arrival date & time 12/18/13  0804 History   First MD Initiated Contact with Patient 12/18/13 0805     Chief Complaint  Patient presents with  . Cough  . Sore Throat   (Consider location/radiation/quality/duration/timing/severity/associated sxs/prior Treatment) Patient is a 28 y.o. female presenting with cough and pharyngitis.  Cough Sore Throat   Pt reports 1-2 weeks of nasal congestion, dry cough and scratchy throat. Symptoms associated with fever initially but none recently. She reports symptoms are worse at night. Has tried Mucinex with minimal relief.   History reviewed. No pertinent past medical history. Past Surgical History  Procedure Laterality Date  . Cesarean section    . Laparoscopy     No family history on file. History  Substance Use Topics  . Smoking status: Never Smoker   . Smokeless tobacco: Not on file  . Alcohol Use: No   OB History   Grav Para Term Preterm Abortions TAB SAB Ect Mult Living                 Review of Systems  Respiratory: Positive for cough.    All other systems reviewed and are negative except as noted in HPI.   Allergies  Review of patient's allergies indicates no known allergies.  Home Medications   Current Outpatient Rx  Name  Route  Sig  Dispense  Refill  . HYDROcodone-acetaminophen (NORCO) 5-325 MG per tablet   Oral   Take 2 tablets by mouth every 6 (six) hours as needed for pain.   20 tablet   0   . ibuprofen (ADVIL,MOTRIN) 600 MG tablet   Oral   Take 1 tablet (600 mg total) by mouth every 6 (six) hours as needed for pain.   30 tablet   0   . methocarbamol (ROBAXIN) 500 MG tablet   Oral   Take 1 tablet (500 mg total) by mouth 2 (two) times daily.   20 tablet   0   . metroNIDAZOLE (FLAGYL) 500 MG tablet   Oral   Take 1 tablet (500 mg total) by mouth 2 (two) times daily. One po bid x 7 days   14 tablet   0   . penicillin v potassium (VEETID) 500 MG tablet   Oral   Take 2 tablets (1,000  mg total) by mouth 2 (two) times daily. X 7 days   28 tablet   0   . ranitidine (ZANTAC) 150 MG capsule   Oral   Take 1 capsule (150 mg total) by mouth daily.   30 capsule   0   . sulfamethoxazole-trimethoprim (BACTRIM DS,SEPTRA DS) 800-160 MG per tablet   Oral   Take 1 tablet by mouth 2 (two) times daily. One po bid x 3 days   6 tablet   0    BP 152/85  Pulse 107  Temp(Src) 98.7 F (37.1 C) (Oral)  Resp 18  Ht 5\' 6"  (1.676 m)  Wt 264 lb (119.75 kg)  BMI 42.63 kg/m2  SpO2 95%  LMP 11/17/2013 Physical Exam  Nursing note and vitals reviewed. Constitutional: She is oriented to person, place, and time. She appears well-developed and well-nourished.  HENT:  Head: Normocephalic and atraumatic.  Mouth/Throat: No oropharyngeal exudate.  Eyes: EOM are normal. Pupils are equal, round, and reactive to light.  Neck: Normal range of motion. Neck supple.  Cardiovascular: Normal rate, normal heart sounds and intact distal pulses.   Pulmonary/Chest: Effort normal and breath sounds normal.  Abdominal: Bowel sounds are normal. She exhibits no distension. There is no tenderness.  Musculoskeletal: Normal range of motion. She exhibits no edema and no tenderness.  Lymphadenopathy:    She has no cervical adenopathy.  Neurological: She is alert and oriented to person, place, and time. She has normal strength. No cranial nerve deficit or sensory deficit.  Skin: Skin is warm and dry. No rash noted.  Psychiatric: She has a normal mood and affect.    ED Course  Procedures (including critical care time) Labs Review Labs Reviewed - No data to display Imaging Review No results found.  EKG Interpretation   None       MDM   1. Viral URI     Pt with viral URI, no concern for bacterial infection. Advised OTC symptomatic care and PCP followup.     Charles B. Bernette Mayers, MD 12/18/13 [phone]

## 2013-12-21 ENCOUNTER — Emergency Department (HOSPITAL_BASED_OUTPATIENT_CLINIC_OR_DEPARTMENT_OTHER)
Admission: EM | Admit: 2013-12-21 | Discharge: 2013-12-21 | Disposition: A | Discharge status: Home / Self Care | Payer: Medicaid Other | Attending: Emergency Medicine | Admitting: Emergency Medicine

## 2013-12-21 ENCOUNTER — Encounter (HOSPITAL_BASED_OUTPATIENT_CLINIC_OR_DEPARTMENT_OTHER): Payer: Self-pay | Admitting: Emergency Medicine

## 2013-12-21 ENCOUNTER — Emergency Department (HOSPITAL_BASED_OUTPATIENT_CLINIC_OR_DEPARTMENT_OTHER): Payer: Medicaid Other

## 2013-12-21 DIAGNOSIS — IMO0001 Reserved for inherently not codable concepts without codable children: Secondary | ICD-10-CM | POA: Insufficient documentation

## 2013-12-21 DIAGNOSIS — H6120 Impacted cerumen, unspecified ear: Secondary | ICD-10-CM | POA: Insufficient documentation

## 2013-12-21 DIAGNOSIS — J3489 Other specified disorders of nose and nasal sinuses: Secondary | ICD-10-CM | POA: Insufficient documentation

## 2013-12-21 DIAGNOSIS — R05 Cough: Secondary | ICD-10-CM | POA: Insufficient documentation

## 2013-12-21 DIAGNOSIS — R079 Chest pain, unspecified: Secondary | ICD-10-CM | POA: Insufficient documentation

## 2013-12-21 DIAGNOSIS — J029 Acute pharyngitis, unspecified: Secondary | ICD-10-CM | POA: Insufficient documentation

## 2013-12-21 DIAGNOSIS — R6883 Chills (without fever): Secondary | ICD-10-CM | POA: Insufficient documentation

## 2013-12-21 DIAGNOSIS — R6889 Other general symptoms and signs: Secondary | ICD-10-CM

## 2013-12-21 DIAGNOSIS — R059 Cough, unspecified: Secondary | ICD-10-CM | POA: Insufficient documentation

## 2013-12-21 MED ORDER — PHENYLEPH-PROMETHAZINE-COD 5-6.25-10 MG/5ML PO SYRP: 5.0000 mL | ORAL_SOLUTION | Freq: Four times a day (QID) | ORAL | Status: DC | PRN

## 2013-12-21 NOTE — ED Notes (Signed)
C/o cold sx since christmas- now has "hard cough"- seen 1/5 for same but feels no better

## 2013-12-21 NOTE — ED Provider Notes (Signed)
CSN: [id number]     Arrival date & time 12/21/13  1549 History  This chart was scribed for non-physician practitioner Selena BuffaloHope Neese, NP working with Selena HornJohn M Bednar, MD by Selena Boyd, ED Scribe. This patient was seen in room MHT13 and the patient's care was started at 6:20 PM.    Chief Complaint  Patient presents with  . Cough   The history is provided by the patient. No language interpreter was used.   HPI Comments: Selena Boyd is a 28 y.o. female who presents to the Emergency Department complaining of an intermittent, dry cough with associated congestion and chills onset about 2 weeks ago that has been progressively worsening. Patient states the cough is worse at night. She reports some associated chest pain secondary to the cough. She reports some associated clear-yellow sputum from the cough, headache, and sore throat initially, but denies any recently. Patient was seen here on 12/18/2013 (3 days ago) for similar complaints and was diagnosed with a viral URI with advice of symptomatic care and PCP follow up. Patient reports some back pain that is normal for her and denies any recent changes. She denies wheezes, abdominal pain, dysuria, urinary frequency, leg swelling. Patient denies history of asthma and has no other pertinent medical history. Patient is a non-smoker.  History reviewed. No pertinent past medical history. Past Surgical History  Procedure Laterality Date  . Cesarean section    . Laparoscopy     No family history on file. History  Substance Use Topics  . Smoking status: Never Smoker   . Smokeless tobacco: Never Used  . Alcohol Use: No   OB History   Grav Para Term Preterm Abortions TAB SAB Ect Mult Living                 Review of Systems  Constitutional: Positive for chills.  HENT: Positive for congestion and sore throat (resolved).   Respiratory: Positive for cough. Negative for wheezing.   Cardiovascular: Positive for chest pain. Negative for leg swelling.   Gastrointestinal: Negative for abdominal pain.  Genitourinary: Negative for dysuria and frequency.  Musculoskeletal: Positive for back pain (baseline).  Neurological: Positive for headaches (resolved).    Allergies  Review of patient's allergies indicates no known allergies.  Home Medications  No current outpatient prescriptions on file.  Triage Vitals: BP 157/115  Pulse 96  Temp(Src) 98.8 F (37.1 C) (Oral)  Resp 20  Ht 5\' 6"  (1.676 m)  Wt 270 lb (122.471 kg)  BMI 43.60 kg/m2  SpO2 99%  LMP 12/15/2013  Physical Exam  Nursing note and vitals reviewed. Constitutional: She is oriented to person, place, and time. She appears well-developed and well-nourished. No distress.  HENT:  Head: Normocephalic and atraumatic.  Right Ear: Hearing and external ear normal.  Left Ear: Hearing, tympanic membrane, external ear and ear canal normal.  Mouth/Throat: Oropharynx is clear and moist. No oropharyngeal exudate.  Right TM occluded by cerumen. Uvula is midline.   Eyes: Conjunctivae and EOM are normal. Pupils are equal, round, and reactive to light.  Neck: Normal range of motion. Neck supple.  Cardiovascular: Normal rate, regular rhythm and normal heart sounds.   Pulmonary/Chest: Effort normal. No respiratory distress. She has no wheezes.  Occasional rhonchi right.   Abdominal: Soft. Bowel sounds are normal. She exhibits no distension. There is no tenderness.  Musculoskeletal: Normal range of motion.  Lymphadenopathy:    She has no cervical adenopathy.  Neurological: She is alert and oriented to person, place, and  time.  Skin: Skin is warm and dry.  Psychiatric: She has a normal mood and affect. Her behavior is normal.    ED Course  Procedures (including critical care time)  DIAGNOSTIC STUDIES: Oxygen Saturation is 99% on room air, normal by my interpretation.    COORDINATION OF CARE: 6:26 PM- Will order a chest x-ray. Will discharge patient with Phenyleph-Promethazine to  manage symptoms. Discussed treatment plan with patient at bedside and patient verbalized agreement.     Labs Review Labs Reviewed - No data to display  Imaging Review Dg Chest 2 View  12/21/2013   CLINICAL DATA:  Cough, cold symptoms  EXAM: CHEST  2 VIEW  COMPARISON:  06/06/2013  FINDINGS: Cardiomediastinal silhouette is stable. No acute infiltrate or pleural effusion. No pulmonary edema. Bony thorax is unremarkable.  IMPRESSION: No active cardiopulmonary disease.   Electronically Signed   By: Selena Boyd M.D.   On: 12/21/2013 18:46   Dg Chest 2 View  12/21/2013   CLINICAL DATA:  Cough, cold symptoms  EXAM: CHEST  2 VIEW  COMPARISON:  06/06/2013  FINDINGS: Cardiomediastinal silhouette is stable. No acute infiltrate or pleural effusion. No pulmonary edema. Bony thorax is unremarkable.  IMPRESSION: No active cardiopulmonary disease.   Electronically Signed   By: Selena Boyd M.D.   On: 12/21/2013 18:46     MDM  28 y.o. female with cough, cold, congestion and feeling bad since the end of December.  SUBJECTIVE:  Selena Boyd is a 28 y.o. female who present complaining of flu-like symptoms: fevers, chills, myalgias, congestion, sore throat and cough for 14 days. Denies dyspnea or wheezing.  OBJECTIVE: Appears moderately ill but not toxic; temperature as noted in vitals. Ears normal. Throat and pharynx normal.  Neck supple. No adenopathy in the neck. Sinuses non tender. The chest is clear.  ASSESSMENT: Influenza  PLAN: Symptomatic therapy suggested: rest, increase fluids, gargle prn for sore throat, use mist of vaporizer prn and call prn if symptoms persist or worsen. Call or return to the ED if these symptoms worsen or fail to improve as anticipated.   I personally performed the services described in this documentation, which was scribed in my presence. The recorded information has been reviewed and is accurate.     [facility] Selena Boyd, [place] 12/22/13 1743

## 2013-12-22 NOTE — ED Provider Notes (Signed)
Medical screening examination/treatment/procedure(s) were performed by non-physician practitioner and as supervising physician I was immediately available for consultation/collaboration.  EKG Interpretation   None        Carson Bogden M Kwasi Joung, MD 12/22/13 2107 

## 2013-12-22 NOTE — Progress Notes (Signed)
ED CM [facility] Phenyleph-Promethazine-Cod 5-6.25-10 MG/5ML SYRP Take 5 mLs by mouth every 6 (six) hours as needed. Medication is on back order however, there is a substitute and needed confirmation to change to substitute.  Will notify [facility] in FT.

## 2013-12-22 NOTE — ED Provider Notes (Signed)
Medical screening examination/treatment/procedure(s) were performed by non-physician practitioner and as supervising physician I was immediately available for consultation/collaboration.  EKG Interpretation   None        Hurman HornJohn M Nadja Lina, MD 12/22/13 2107

## 2013-12-22 NOTE — ED Provider Notes (Signed)
CSN: [id number]     Arrival date & time 12/21/13  1549 History   First MD Initiated Contact with Patient 12/21/13 1815     Chief Complaint  Patient presents with  . Cough   (Consider location/radiation/quality/duration/timing/severity/associated sxs/prior Treatment) HPI Selena Boyd is a 28 y.o. female who presents to the ED with flu like symptoms that started the first part of Jan. She was evaluated and treated with OTC medications for URI. She has continued to cough and feel bad. She is having difficulty sleeping at night due to the cough. She denies fever or chills, nausea or vomiting or other problems. Just a bad cough.   History reviewed. No pertinent past medical history. Past Surgical History  Procedure Laterality Date  . Cesarean section    . Laparoscopy     No family history on file. History  Substance Use Topics  . Smoking status: Never Smoker   . Smokeless tobacco: Never Used  . Alcohol Use: No   OB History   Grav Para Term Preterm Abortions TAB SAB Ect Mult Living                 Review of Systems Negative except as stated in HPI  Allergies  Review of patient's allergies indicates no known allergies.  Home Medications   Current Outpatient Rx  Name  Route  Sig  Dispense  Refill  . Phenyleph-Promethazine-Cod 5-6.25-10 MG/5ML SYRP   Oral   Take 5 mLs by mouth every 6 (six) hours as needed.   120 mL   0    BP 144/88  Pulse 94  Temp(Src) 98.8 F (37.1 C) (Oral)  Resp 20  Ht 5\' 6"  (1.676 m)  Wt 270 lb (122.471 kg)  BMI 43.60 kg/m2  SpO2 100%  LMP 12/15/2013 Physical Exam  Nursing note and vitals reviewed. Constitutional: She is oriented to person, place, and time. She appears well-developed and well-nourished. No distress.  HENT:  Head: Normocephalic and atraumatic.  Eyes: EOM are normal.  Neck: Neck supple.  Cardiovascular: Normal rate and regular rhythm.   Pulmonary/Chest: Effort normal. She has no wheezes. She has no rales.  Abdominal: Soft.  There is no tenderness.  Musculoskeletal: Normal range of motion.  Neurological: She is alert and oriented to person, place, and time. No cranial nerve deficit.  Skin: Skin is warm and dry.  Psychiatric: She has a normal mood and affect. Her behavior is normal.    ED Course  Procedures (including critical care time) Labs Review Labs Reviewed - No data to display Imaging Review Dg Chest 2 View  12/21/2013   CLINICAL DATA:  Cough, cold symptoms  EXAM: CHEST  2 VIEW  COMPARISON:  06/06/2013  FINDINGS: Cardiomediastinal silhouette is stable. No acute infiltrate or pleural effusion. No pulmonary edema. Bony thorax is unremarkable.  IMPRESSION: No active cardiopulmonary disease.   Electronically Signed   By: Natasha Mead M.D.   On: 12/21/2013 18:46     MDM   1. Flu-like symptoms   SUBJECTIVE:  Selena Boyd is a 28 y.o. female who present complaining of flu-like symptoms: fevers, chills, myalgias, congestion, sore throat and cough for 2 weeks. Denies dyspnea or wheezing.  OBJECTIVE: Appears moderately ill but not toxic; temperature as noted in vitals. Ears normal. Throat and pharynx normal.  Neck supple. No adenopathy in the neck. Sinuses non tender. The chest is clear.  ASSESSMENT: Influenza  PLAN: Symptomatic therapy suggested: rest, increase fluids, gargle prn for sore throat, use mist of vaporizer  prn and call prn if symptoms persist or worsen. Call or return to clinic prn if these symptoms worsen or fail to improve as anticipated.   Medication List         Phenyleph-Promethazine-Cod 5-6.25-10 MG/5ML Syrp  Take 5 mLs by mouth every 6 (six) hours as needed.            Janne NapoleonHope M Neese, [place] 12/22/13 [phone]

## 2014-01-02 ENCOUNTER — Encounter (HOSPITAL_BASED_OUTPATIENT_CLINIC_OR_DEPARTMENT_OTHER): Payer: Self-pay | Admitting: Emergency Medicine

## 2014-01-02 ENCOUNTER — Emergency Department (HOSPITAL_BASED_OUTPATIENT_CLINIC_OR_DEPARTMENT_OTHER)
Admission: EM | Admit: 2014-01-02 | Discharge: 2014-01-02 | Disposition: A | Discharge status: Home / Self Care | Payer: Medicaid Other | Attending: Emergency Medicine | Admitting: Emergency Medicine

## 2014-01-02 DIAGNOSIS — R197 Diarrhea, unspecified: Secondary | ICD-10-CM | POA: Insufficient documentation

## 2014-01-02 DIAGNOSIS — R112 Nausea with vomiting, unspecified: Secondary | ICD-10-CM | POA: Insufficient documentation

## 2014-01-02 DIAGNOSIS — J069 Acute upper respiratory infection, unspecified: Secondary | ICD-10-CM | POA: Insufficient documentation

## 2014-01-02 MED ORDER — BENZONATATE 100 MG PO CAPS: 100.0000 mg | ORAL_CAPSULE | Freq: Three times a day (TID) | ORAL | Status: DC

## 2014-01-02 MED ORDER — ONDANSETRON HCL 4 MG PO TABS: 4.0000 mg | ORAL_TABLET | Freq: Four times a day (QID) | ORAL | Status: DC

## 2014-01-02 NOTE — Discharge Instructions (Signed)
Antibiotic Nonuse  Your caregiver felt that the infection or problem was not one that would be helped with an antibiotic. Infections may be caused by viruses or bacteria. Only a caregiver can tell which one of these is the likely cause of an illness. A cold is the most common cause of infection in both adults and children. A cold is a virus. Antibiotic treatment will have no effect on a viral infection. Viruses can lead to many lost days of work caring for sick children and many missed days of school. Children may catch as many as 10 "colds" or "flus" per year during which they can be tearful, cranky, and uncomfortable. The goal of treating a virus is aimed at keeping the ill person comfortable. Antibiotics are medications used to help the body fight bacterial infections. There are relatively few types of bacteria that cause infections but there are hundreds of viruses. While both viruses and bacteria cause infection they are very different types of germs. A viral infection will typically go away by itself within 7 to 10 days. Bacterial infections may spread or get worse without antibiotic treatment. Examples of bacterial infections are:  Sore throats (like strep throat or tonsillitis).  Infection in the lung (pneumonia).  Ear and skin infections. Examples of viral infections are:  Colds or flus.  Most coughs and bronchitis.  Sore throats not caused by Strep.  Runny noses. It is often best not to take an antibiotic when a viral infection is the cause of the problem. Antibiotics can kill off the helpful bacteria that we have inside our body and allow harmful bacteria to start growing. Antibiotics can cause side effects such as allergies, nausea, and diarrhea without helping to improve the symptoms of the viral infection. Additionally, repeated uses of antibiotics can cause bacteria inside of our body to become resistant. That resistance can be passed onto harmful bacterial. The next time you have  an infection it may be harder to treat if antibiotics are used when they are not needed. Not treating with antibiotics allows our own immune system to develop and take care of infections more efficiently. Also, antibiotics will work better for [place] when they are prescribed for bacterial infections. Treatments for a child that is ill may include:  Give extra fluids throughout the day to stay hydrated.  Get plenty of rest.  Only give your child over-the-counter or prescription medicines for pain, discomfort, or fever as directed by your caregiver.  The use of a cool mist humidifier may help stuffy noses.  Cold medications if suggested by your caregiver. Your caregiver may decide to start you on an antibiotic if:  The problem you were seen for today continues for a longer length of time than expected.  You develop a secondary bacterial infection. SEEK MEDICAL CARE IF:  Fever lasts longer than 5 days.  Symptoms continue to get worse after 5 to 7 days or become severe.  Difficulty in breathing develops.  Signs of dehydration develop (poor drinking, rare urinating, dark colored urine).  Changes in behavior or worsening tiredness (listlessness or lethargy). Document Released: 02/05/2002 Document Revised: 02/19/2012 Document Reviewed: 08/04/2009 [facility] Patient Information 2014 [place], [place]. Diarrhea Diarrhea is frequent loose and watery bowel movements. It can cause you to feel weak and dehydrated. Dehydration can cause you to become tired and thirsty, have a dry mouth, and have decreased urination that often is dark yellow. Diarrhea is a sign of another problem, most often an infection that will not last long.  In most cases, diarrhea typically lasts 2 3 days. However, it can last longer if it is a sign of something more serious. It is important to treat your diarrhea as directed by your caregive to lessen or prevent future episodes of diarrhea. CAUSES  Some common causes  include:  Gastrointestinal infections caused by viruses, bacteria, or parasites.  Food poisoning or food allergies.  Certain medicines, such as antibiotics, chemotherapy, and laxatives.  Artificial sweeteners and fructose.  Digestive disorders. HOME CARE INSTRUCTIONS  Ensure adequate fluid intake (hydration): have 1 cup (8 oz) of fluid for each diarrhea episode. Avoid fluids that contain simple sugars or sports drinks, fruit juices, whole milk products, and sodas. Your urine should be clear or pale yellow if you are drinking enough fluids. Hydrate with an oral rehydration solution that you can purchase at pharmacies, retail stores, and online. You can prepare an oral rehydration solution at home by mixing the following ingredients together:    tsp table salt.   tsp baking soda.   tsp salt substitute containing potassium chloride.  1  tablespoons sugar.  1 L (34 oz) of water.  Certain foods and beverages may increase the speed at which food moves through the gastrointestinal (GI) tract. These foods and beverages should be avoided and include:  Caffeinated and alcoholic beverages.  High-fiber foods, such as raw fruits and vegetables, nuts, seeds, and whole grain breads and cereals.  Foods and beverages sweetened with sugar alcohols, such as xylitol, sorbitol, and mannitol.  Some foods may be well tolerated and may help thicken stool including:  Starchy foods, such as rice, toast, pasta, low-sugar cereal, oatmeal, grits, baked potatoes, crackers, and bagels.  Bananas.  Applesauce.  Add probiotic-rich foods to help increase healthy bacteria in the GI tract, such as yogurt and fermented milk products.  Wash your hands well after each diarrhea episode.  Only take over-the-counter or prescription medicines as directed by your caregiver.  Take a warm bath to relieve any burning or pain from frequent diarrhea episodes. SEEK IMMEDIATE MEDICAL CARE IF:   You are unable to keep  fluids down.  You have persistent vomiting.  You have blood in your stool, or your stools are black and tarry.  You do not urinate in 6 8 hours, or there is only a small amount of very dark urine.  You have abdominal pain that increases or localizes.  You have weakness, dizziness, confusion, or lightheadedness.  You have a severe headache.  Your diarrhea gets worse or does not get better.  You have a fever or persistent symptoms for more than 2 3 days.  You have a fever and your symptoms suddenly get worse. MAKE SURE YOU:   Understand these instructions.  Will watch your condition.  Will get help right away if you are not doing well or get worse. Document Released: 11/17/2002 Document Revised: 11/13/2012 Document Reviewed: 08/04/2012 [facility] Patient Information 2014 [place], [place]. Upper Respiratory Infection, Adult An upper respiratory infection (URI) is also known as the common cold. It is often caused by a type of germ (virus). Colds are easily spread (contagious). You can pass it to others by kissing, coughing, sneezing, or drinking out of the same glass. Usually, you get better in 1 or 2 weeks.  HOME CARE   Only take medicine as told by your doctor.  Use a warm mist humidifier or breathe in steam from a hot shower.  Drink enough water and fluids to keep your pee (urine) clear or pale  yellow.  Get plenty of rest.  Return to work when your temperature is back to normal or as told by your doctor. You may use a face mask and wash your hands to stop your cold from spreading. GET HELP RIGHT AWAY IF:   After the first few days, you feel you are getting worse.  You have questions about your medicine.  You have chills, shortness of breath, or brown or red spit (mucus).  You have yellow or brown snot (nasal discharge) or pain in the face, especially when you bend forward.  You have a fever, puffy (swollen) neck, pain when you swallow, or white spots in the back of your  throat.  You have a bad headache, ear pain, sinus pain, or chest pain.  You have a high-pitched whistling sound when you breathe in and out (wheezing).  You have a lasting cough or cough up blood.  You have sore muscles or a stiff neck. MAKE SURE YOU:   Understand these instructions.  Will watch your condition.  Will get help right away if you are not doing well or get worse. Document Released: 05/15/2008 Document Revised: 02/19/2012 Document Reviewed: 04/03/2011 [facility] Patient Information 2014 [place].

## 2014-01-02 NOTE — ED Provider Notes (Signed)
CSN: [id number]     Arrival date & time 01/02/14  1153 History   First MD Initiated Contact with Patient 01/02/14 1209     Chief Complaint  Patient presents with  . Emesis   (Consider location/radiation/quality/duration/timing/severity/associated sxs/prior Treatment) Patient is a 28 y.o. female presenting with vomiting. The history is provided by the patient. No language interpreter was used.  Emesis Severity:  Moderate Associated symptoms: no abdominal pain and no chills   Associated symptoms comment:  Lingering cough for the past 2-3 weeks, now with nausea and vomiting as well. She presents with her son who has similar symptoms and reports multiple family members with the same. No significant fever.   History reviewed. No pertinent past medical history. Past Surgical History  Procedure Laterality Date  . Cesarean section    . Laparoscopy     History reviewed. No pertinent family history. History  Substance Use Topics  . Smoking status: Never Smoker   . Smokeless tobacco: Never Used  . Alcohol Use: No   OB History   Grav Para Term Preterm Abortions TAB SAB Ect Mult Living                 Review of Systems  Constitutional: Negative for fever and chills.  HENT: Positive for congestion.   Respiratory: Positive for cough. Negative for shortness of breath.   Cardiovascular: Negative.   Gastrointestinal: Positive for vomiting. Negative for abdominal pain.  Musculoskeletal: Negative.   Skin: Negative.   Neurological: Negative.     Allergies  Review of patient's allergies indicates no known allergies.  Home Medications   Current Outpatient Rx  Name  Route  Sig  Dispense  Refill  . Phenyleph-Promethazine-Cod 5-6.25-10 MG/5ML SYRP   Oral   Take 5 mLs by mouth every 6 (six) hours as needed.   120 mL   0    BP 130/83  Pulse 93  Temp(Src) 98 F (36.7 C) (Oral)  Resp 18  Ht 5\' 6"  (1.676 m)  Wt 270 lb (122.471 kg)  BMI 43.60 kg/m2  SpO2 98%  LMP  12/15/2013 Physical Exam  Constitutional: She is oriented to person, place, and time. She appears well-developed and well-nourished.  HENT:  Head: Normocephalic.  Neck: Normal range of motion. Neck supple.  Cardiovascular: Normal rate and regular rhythm.   Pulmonary/Chest: Effort normal and breath sounds normal.  Abdominal: Soft. Bowel sounds are normal. There is no tenderness. There is no rebound and no guarding.  Musculoskeletal: Normal range of motion.  Neurological: She is alert and oriented to person, place, and time.  Skin: Skin is warm and dry. No rash noted.  Psychiatric: She has a normal mood and affect.    ED Course  Procedures (including critical care time) Labs Review Labs Reviewed - No data to display Imaging Review No results found.  EKG Interpretation   None       MDM  No diagnosis found. 1. Uri 2. Nausea and vomiting  Uncomplicated viral picture with associated vomiting and diarrhea. Well appearing patient, VSS, afebrile. NO sign of dehydration. Stable for discharge with supportive care.     Arnoldo HookerShari A Roseana Rhine, PA-C 01/02/14 1301

## 2014-01-02 NOTE — ED Notes (Signed)
Reports that she has been seen x 2 times for same.  Vomiting and diarrhea last night.  Denies fever.

## 2014-01-02 NOTE — ED Provider Notes (Signed)
Medical screening examination/treatment/procedure(s) were performed by non-physician practitioner and as supervising physician I was immediately available for consultation/collaboration.  EKG Interpretation   None         Courtney F Horton, MD 01/02/14 1315 

## 2014-02-05 ENCOUNTER — Encounter (HOSPITAL_BASED_OUTPATIENT_CLINIC_OR_DEPARTMENT_OTHER): Payer: Self-pay | Admitting: Emergency Medicine

## 2014-02-05 ENCOUNTER — Emergency Department (HOSPITAL_BASED_OUTPATIENT_CLINIC_OR_DEPARTMENT_OTHER)
Admission: EM | Admit: 2014-02-05 | Discharge: 2014-02-05 | Disposition: A | Discharge status: Home / Self Care | Payer: Medicaid Other | Attending: Emergency Medicine | Admitting: Emergency Medicine

## 2014-02-05 DIAGNOSIS — K2990 Gastroduodenitis, unspecified, without bleeding: Principal | ICD-10-CM

## 2014-02-05 DIAGNOSIS — N898 Other specified noninflammatory disorders of vagina: Secondary | ICD-10-CM | POA: Insufficient documentation

## 2014-02-05 DIAGNOSIS — N920 Excessive and frequent menstruation with regular cycle: Secondary | ICD-10-CM | POA: Insufficient documentation

## 2014-02-05 DIAGNOSIS — K2970 Gastritis, unspecified, without bleeding: Secondary | ICD-10-CM | POA: Insufficient documentation

## 2014-02-05 DIAGNOSIS — Z3202 Encounter for pregnancy test, result negative: Secondary | ICD-10-CM | POA: Insufficient documentation

## 2014-02-05 DIAGNOSIS — E663 Overweight: Secondary | ICD-10-CM | POA: Insufficient documentation

## 2014-02-05 LAB — COMPREHENSIVE METABOLIC PANEL
ALBUMIN: 3.7 g/dL (ref 3.5–5.2)
ALK PHOS: 59 U/L (ref 39–117)
ALT: 18 U/L (ref 0–35)
AST: 20 U/L (ref 0–37)
BUN: 12 mg/dL (ref 6–23)
CALCIUM: 9.6 mg/dL (ref 8.4–10.5)
CO2: 24 mEq/L (ref 19–32)
Chloride: 104 mEq/L (ref 96–112)
Creatinine, Ser: 0.9 mg/dL (ref 0.50–1.10)
GFR calc non Af Amer: 86 mL/min — ABNORMAL LOW (ref >90)
Glucose, Bld: 84 mg/dL (ref 70–99)
POTASSIUM: 4.3 meq/L (ref 3.7–5.3)
SODIUM: 140 meq/L (ref 137–147)
TOTAL PROTEIN: 8.3 g/dL (ref 6.0–8.3)
Total Bilirubin: <0.2 mg/dL — ABNORMAL LOW (ref 0.3–1.2)

## 2014-02-05 LAB — URINALYSIS, ROUTINE W REFLEX MICROSCOPIC
BILIRUBIN URINE: NEGATIVE
GLUCOSE, UA: NEGATIVE mg/dL
KETONES UR: NEGATIVE mg/dL
LEUKOCYTES UA: NEGATIVE
NITRITE: NEGATIVE
PH: 6.5 (ref 5.0–8.0)
Protein, ur: 30 mg/dL — AB
SPECIFIC GRAVITY, URINE: 1.026 (ref 1.005–1.030)
Urobilinogen, UA: 0.2 mg/dL (ref 0.0–1.0)

## 2014-02-05 LAB — CBC WITH DIFFERENTIAL/PLATELET
BASOS PCT: 0 % (ref 0–1)
Basophils Absolute: 0 10*3/uL (ref 0.0–0.1)
EOS ABS: 0.3 10*3/uL (ref 0.0–0.7)
EOS PCT: 4 % (ref 0–5)
HEMATOCRIT: 38.6 % (ref 36.0–46.0)
HEMOGLOBIN: 12.5 g/dL (ref 12.0–15.0)
Lymphocytes Relative: 44 % (ref 12–46)
Lymphs Abs: 2.8 10*3/uL (ref 0.7–4.0)
MCH: 28.4 pg (ref 26.0–34.0)
MCHC: 32.4 g/dL (ref 30.0–36.0)
MCV: 87.7 fL (ref 78.0–100.0)
Monocytes Absolute: 0.8 10*3/uL (ref 0.1–1.0)
Monocytes Relative: 12 % (ref 3–12)
NEUTROS PCT: 39 % — AB (ref 43–77)
Neutro Abs: 2.5 10*3/uL (ref 1.7–7.7)
PLATELETS: 487 10*3/uL — AB (ref 150–400)
RBC: 4.4 MIL/uL (ref 3.87–5.11)
RDW: 14 % (ref 11.5–15.5)
WBC: 6.4 10*3/uL (ref 4.0–10.5)

## 2014-02-05 LAB — URINE MICROSCOPIC-ADD ON

## 2014-02-05 LAB — PREGNANCY, URINE: Preg Test, Ur: NEGATIVE

## 2014-02-05 LAB — LIPASE, BLOOD: LIPASE: 28 U/L (ref 11–59)

## 2014-02-05 MED ORDER — GI COCKTAIL ~~LOC~~
30.0000 mL | Freq: Once | ORAL | Status: AC
  Administered 2014-02-05: 30 mL via ORAL
  Filled 2014-02-05: qty 30

## 2014-02-05 MED ORDER — FAMOTIDINE 20 MG PO TABS: 20.0000 mg | ORAL_TABLET | Freq: Two times a day (BID) | ORAL | Status: DC | PRN

## 2014-02-05 MED ORDER — ONDANSETRON HCL 4 MG/2ML IJ SOLN
4.0000 mg | Freq: Once | INTRAMUSCULAR | Status: AC
  Administered 2014-02-05: 4 mg via INTRAVENOUS
  Filled 2014-02-05: qty 2

## 2014-02-05 MED ORDER — ESOMEPRAZOLE MAGNESIUM 40 MG PO CPDR: 40.0000 mg | DELAYED_RELEASE_CAPSULE | Freq: Every day | ORAL | Status: DC

## 2014-02-05 MED ORDER — FAMOTIDINE IN NACL 20-0.9 MG/50ML-% IV SOLN
20.0000 mg | Freq: Once | INTRAVENOUS | Status: AC
  Administered 2014-02-05: 20 mg via INTRAVENOUS
  Filled 2014-02-05: qty 50

## 2014-02-05 MED ORDER — SODIUM CHLORIDE 0.9 % IV BOLUS (SEPSIS)
1000.0000 mL | Freq: Once | INTRAVENOUS | Status: AC
  Administered 2014-02-05: 1000 mL via INTRAVENOUS

## 2014-02-05 NOTE — ED Notes (Signed)
Pt reports abdominal pain and vaginal bleeding since 01/25/2014.

## 2014-02-05 NOTE — ED Provider Notes (Signed)
CSN: [id number]     Arrival date & time 02/05/14  1709 History   First MD Initiated Contact with Patient 02/05/14 1718     Chief Complaint  Patient presents with  . Abdominal Pain  . Vaginal Bleeding     (Consider location/radiation/quality/duration/timing/severity/associated sxs/prior Treatment) The history is provided by the patient.  Selena Boyd is a 28 y.o. female here with abdominal pain. Epigastric pain that is intermittent for the last several weeks. Denies any radiation to the pain. She occasionally has some nausea vomiting but is currently eating a lot of fried food. She also has been having vaginal bleeding for the last 10 days, which is unusual for her. She also has some lower abdominal cramps when she has the bleeding. Denies chest pain, shortness of breath, passing out. Denies being pregnant.    History reviewed. No pertinent past medical history. Past Surgical History  Procedure Laterality Date  . Cesarean section    . Laparoscopy     No family history on file. History  Substance Use Topics  . Smoking status: Never Smoker   . Smokeless tobacco: Never Used  . Alcohol Use: No   OB History   Grav Para Term Preterm Abortions TAB SAB Ect Mult Living                 Review of Systems  Gastrointestinal: Positive for abdominal pain.  Genitourinary: Positive for vaginal bleeding.  All other systems reviewed and are negative.      Allergies  Review of patient's allergies indicates no known allergies.  Home Medications   Current Outpatient Rx  Name  Route  Sig  Dispense  Refill  . benzonatate (TESSALON) 100 MG capsule   Oral   Take 1 capsule (100 mg total) by mouth every 8 (eight) hours.   21 capsule   0   . ondansetron (ZOFRAN) 4 MG tablet   Oral   Take 1 tablet (4 mg total) by mouth every 6 (six) hours.   12 tablet   0   . Phenyleph-Promethazine-Cod 5-6.25-10 MG/5ML SYRP   Oral   Take 5 mLs by mouth every 6 (six) hours as needed.   120 mL    0    BP 135/81  Pulse 81  Temp(Src) 98.2 F (36.8 C) (Oral)  Resp 16  Ht 5\' 6"  (1.676 m)  Wt 207 lb (93.895 kg)  BMI 33.43 kg/m2  SpO2 98%  LMP 01/25/2014 Physical Exam  Nursing note and vitals reviewed. Constitutional: She is oriented to person, place, and time. She appears well-developed and well-nourished.  Overweight, comfortable   HENT:  Head: Normocephalic.  Mouth/Throat: Oropharynx is clear and moist.  Eyes: Conjunctivae are normal. Pupils are equal, round, and reactive to light.  Neck: Normal range of motion. Neck supple.  Cardiovascular: Normal rate, regular rhythm and normal heart sounds.   Pulmonary/Chest: Effort normal and breath sounds normal. No respiratory distress. She has no wheezes. She has no rales.  Abdominal: Soft. Bowel sounds are normal.  Mild epigastric tenderness, no RUQ tenderness. No RLQ tenderness. No pelvic tenderness   Genitourinary:  Deferred (patient doesn't want it)  Musculoskeletal: Normal range of motion.  Neurological: She is alert and oriented to person, place, and time. No cranial nerve deficit. Coordination normal.  Skin: Skin is warm and dry.  Psychiatric: She has a normal mood and affect. Her behavior is normal. Judgment and thought content normal.    ED Course  Procedures (including critical care time) Labs Review  Labs Reviewed  CBC WITH DIFFERENTIAL - Abnormal; Notable for the following:    Platelets 487 (*)    Neutrophils Relative % 39 (*)    All other components within normal limits  COMPREHENSIVE METABOLIC PANEL - Abnormal; Notable for the following:    Total Bilirubin <0.2 (*)    GFR calc non Af Amer 86 (*)    All other components within normal limits  URINALYSIS, ROUTINE W REFLEX MICROSCOPIC - Abnormal; Notable for the following:    Color, Urine RED (*)    APPearance TURBID (*)    Hgb urine dipstick LARGE (*)    Protein, ur 30 (*)    All other components within normal limits  URINE MICROSCOPIC-ADD ON - Abnormal;  Notable for the following:    Squamous Epithelial / LPF FEW (*)    All other components within normal limits  PREGNANCY, URINE  LIPASE, BLOOD   Imaging Review No results found.  EKG Interpretation   None       MDM   Final diagnoses:  None   Lanice SchwabStephanie Donegan is a 28 y.o. female here with abdominal pain. Likely gastritis vs menstrual cramps. I doubt appendicitis or acute chole. Will get labs, hydrate, give pepcid and reassess.   6:24 PM Labs unremarkable. UA + blood but she is on her period. I think she likely has gastritis and menorrhagia. Recommend pepcid, nexium, diet modifications, GI and GYN f/u.    Richardean Canalavid H Ciria Bernardini, MD 02/05/14 [phone]

## 2014-02-05 NOTE — Discharge Instructions (Signed)
Eat healthy, avoid fried food.   Take pepcid as needed for stomach upset.   Take Nexium daily.   You need to see a GI doctor.   [facility]   Return to ER if you have worse bleeding, severe pain, vomiting, chest pain, passing out.

## 2014-08-04 ENCOUNTER — Emergency Department (HOSPITAL_BASED_OUTPATIENT_CLINIC_OR_DEPARTMENT_OTHER)
Admission: EM | Admit: 2014-08-04 | Discharge: 2014-08-04 | Disposition: A | Discharge status: Home / Self Care | Payer: Medicaid Other | Attending: Emergency Medicine | Admitting: Emergency Medicine

## 2014-08-04 ENCOUNTER — Encounter (HOSPITAL_BASED_OUTPATIENT_CLINIC_OR_DEPARTMENT_OTHER): Payer: Self-pay | Admitting: Emergency Medicine

## 2014-08-04 ENCOUNTER — Emergency Department (HOSPITAL_BASED_OUTPATIENT_CLINIC_OR_DEPARTMENT_OTHER): Payer: Medicaid Other

## 2014-08-04 DIAGNOSIS — R1084 Generalized abdominal pain: Secondary | ICD-10-CM | POA: Insufficient documentation

## 2014-08-04 DIAGNOSIS — N949 Unspecified condition associated with female genital organs and menstrual cycle: Secondary | ICD-10-CM | POA: Diagnosis not present

## 2014-08-04 DIAGNOSIS — M549 Dorsalgia, unspecified: Secondary | ICD-10-CM | POA: Insufficient documentation

## 2014-08-04 DIAGNOSIS — Z3202 Encounter for pregnancy test, result negative: Secondary | ICD-10-CM | POA: Insufficient documentation

## 2014-08-04 DIAGNOSIS — N939 Abnormal uterine and vaginal bleeding, unspecified: Secondary | ICD-10-CM

## 2014-08-04 DIAGNOSIS — N938 Other specified abnormal uterine and vaginal bleeding: Secondary | ICD-10-CM | POA: Diagnosis not present

## 2014-08-04 DIAGNOSIS — R51 Headache: Secondary | ICD-10-CM | POA: Insufficient documentation

## 2014-08-04 DIAGNOSIS — Z79899 Other long term (current) drug therapy: Secondary | ICD-10-CM | POA: Insufficient documentation

## 2014-08-04 DIAGNOSIS — N898 Other specified noninflammatory disorders of vagina: Secondary | ICD-10-CM | POA: Insufficient documentation

## 2014-08-04 DIAGNOSIS — R079 Chest pain, unspecified: Secondary | ICD-10-CM | POA: Insufficient documentation

## 2014-08-04 LAB — URINALYSIS, ROUTINE W REFLEX MICROSCOPIC
Bilirubin Urine: NEGATIVE
GLUCOSE, UA: NEGATIVE mg/dL
KETONES UR: NEGATIVE mg/dL
Leukocytes, UA: NEGATIVE
Nitrite: NEGATIVE
PROTEIN: 30 mg/dL — AB
Specific Gravity, Urine: 1.024 (ref 1.005–1.030)
UROBILINOGEN UA: 0.2 mg/dL (ref 0.0–1.0)
pH: 6 (ref 5.0–8.0)

## 2014-08-04 LAB — CBC WITH DIFFERENTIAL/PLATELET
BASOS ABS: 0 10*3/uL (ref 0.0–0.1)
Basophils Relative: 0 % (ref 0–1)
EOS PCT: 2 % (ref 0–5)
Eosinophils Absolute: 0.1 10*3/uL (ref 0.0–0.7)
HEMATOCRIT: 40.2 % (ref 36.0–46.0)
HEMOGLOBIN: 13.1 g/dL (ref 12.0–15.0)
LYMPHS ABS: 2.2 10*3/uL (ref 0.7–4.0)
LYMPHS PCT: 30 % (ref 12–46)
MCH: 27.7 pg (ref 26.0–34.0)
MCHC: 32.6 g/dL (ref 30.0–36.0)
MCV: 85 fL (ref 78.0–100.0)
MONO ABS: 0.6 10*3/uL (ref 0.1–1.0)
MONOS PCT: 9 % (ref 3–12)
NEUTROS ABS: 4.3 10*3/uL (ref 1.7–7.7)
Neutrophils Relative %: 59 % (ref 43–77)
Platelets: 506 10*3/uL — ABNORMAL HIGH (ref 150–400)
RBC: 4.73 MIL/uL (ref 3.87–5.11)
RDW: 14.9 % (ref 11.5–15.5)
WBC: 7.3 10*3/uL (ref 4.0–10.5)

## 2014-08-04 LAB — PREGNANCY, URINE: PREG TEST UR: NEGATIVE

## 2014-08-04 LAB — BASIC METABOLIC PANEL
ANION GAP: 12 (ref 5–15)
BUN: 11 mg/dL (ref 6–23)
CALCIUM: 10.1 mg/dL (ref 8.4–10.5)
CHLORIDE: 102 meq/L (ref 96–112)
CO2: 25 meq/L (ref 19–32)
CREATININE: 0.9 mg/dL (ref 0.50–1.10)
GFR calc Af Amer: >90 mL/min (ref >90)
GFR calc non Af Amer: 86 mL/min — ABNORMAL LOW (ref >90)
GLUCOSE: 102 mg/dL — AB (ref 70–99)
Potassium: 4 mEq/L (ref 3.7–5.3)
Sodium: 139 mEq/L (ref 137–147)

## 2014-08-04 LAB — URINE MICROSCOPIC-ADD ON

## 2014-08-04 MED ORDER — MEGESTROL ACETATE 40 MG PO TABS: 40.0000 mg | ORAL_TABLET | Freq: Two times a day (BID) | ORAL | Status: DC

## 2014-08-04 NOTE — ED Provider Notes (Signed)
CSN: [id number]     Arrival date & time 08/04/14  1850 History  This chart was scribed for Selena Mulders, MD by Roxy Cedar, ED Scribe. This patient was seen in room MH05/MH05 and the patient's care was started at 7:48 PM.  Chief Complaint  Patient presents with  . Abdominal Pain  . Vaginal Bleeding   Patient is a 28 y.o. female presenting with abdominal pain, vaginal bleeding, and back pain. The history is provided by the patient. No language interpreter was used.  Abdominal Pain Pain location:  Generalized Pain quality: aching and cramping   Pain radiates to:  Back Pain severity:  Moderate Onset quality:  Gradual Timing:  Intermittent Associated symptoms: chest pain and vaginal bleeding   Associated symptoms: no chills, no cough, no dysuria, no fever, no nausea, no shortness of breath, no sore throat and no vomiting   Vaginal Bleeding Duration:  23 days Chronicity:  Recurrent Associated symptoms: abdominal pain and back pain   Associated symptoms: no dysuria, no fever and no nausea   Back Pain Location:  Lumbar spine Quality:  Aching Associated symptoms: abdominal pain, chest pain and headaches   Associated symptoms: no dysuria and no fever     HPI Comments: Della Scrivener is a 28 y.o. female with a history of a laparoscopy and cesarean section, who presents to the Emergency Department complaining of vaginal pain and constant abdominal pain that began 2 weeks ago in the lower quadrants of the abdomen. She describes her abdominal pain as "cramping". She began her menstrual cycle on 07/31/14 and has not stopped bleeding since then. Patient is not currently on blood control medications. Patient states she has experienced similar symptoms before and was treated with Megace in March 2015. Patient rates pain as 5/10 and states that it radiates to the back. She denies any associated dysuria, nausea, vomitting, or syncope.  History reviewed. No pertinent past medical history. Past  Surgical History  Procedure Laterality Date  . Cesarean section    . Laparoscopy     No family history on file. History  Substance Use Topics  . Smoking status: Never Smoker   . Smokeless tobacco: Never Used  . Alcohol Use: No   OB History   Grav Para Term Preterm Abortions TAB SAB Ect Mult Living                 Review of Systems  Constitutional: Negative for fever and chills.  HENT: Negative for rhinorrhea and sore throat.   Eyes: Negative for visual disturbance.  Respiratory: Negative for cough and shortness of breath.   Cardiovascular: Positive for chest pain. Negative for leg swelling.  Gastrointestinal: Positive for abdominal pain. Negative for nausea and vomiting.  Genitourinary: Positive for vaginal bleeding. Negative for dysuria.  Musculoskeletal: Positive for back pain.  Skin: Negative for pallor.  Neurological: Positive for headaches. Negative for syncope and light-headedness.  Hematological: Does not bruise/bleed easily.  Psychiatric/Behavioral: Negative for confusion.    Allergies  Review of patient's allergies indicates no known allergies.  Home Medications   Prior to Admission medications   Medication Sig Start Date End Date Taking? Authorizing Provider  benzonatate (TESSALON) 100 MG capsule Take 1 capsule (100 mg total) by mouth every 8 (eight) hours. 01/02/14   Shari A Upstill, PA-C  esomeprazole (NEXIUM) 40 MG capsule Take 1 capsule (40 mg total) by mouth daily. 02/05/14   Richardean Canal, MD  famotidine (PEPCID) 20 MG tablet Take 1 tablet (20 mg total) by  mouth 2 (two) times daily as needed for heartburn or indigestion. 02/05/14   Richardean Canal, MD  megestrol (MEGACE) 40 MG tablet Take 1 tablet (40 mg total) by mouth 2 (two) times daily. 08/04/14   Selena Mulders, MD  ondansetron (ZOFRAN) 4 MG tablet Take 1 tablet (4 mg total) by mouth every 6 (six) hours. 01/02/14   Shari A Upstill, PA-C  Phenyleph-Promethazine-Cod 5-6.25-10 MG/5ML SYRP Take 5 mLs by mouth every  6 (six) hours as needed. 12/21/13   Hope Orlene Och, NP   Triage Vitals: BP 151/102  Pulse 92  Temp(Src) 98.3 F (36.8 C)  Resp 18  Ht  (1.676 m)  Wt 270 lb (122.471 kg)  BMI 43.60 kg/m2  SpO2 98%  LMP 08/04/2014 Physical Exam  Nursing note and vitals reviewed. Constitutional: She is oriented to person, place, and time. She appears well-developed and well-nourished.  HENT:  Head: Normocephalic.  Mouth/Throat: Oropharynx is clear and moist.  Eyes: Conjunctivae and EOM are normal. Pupils are equal, round, and reactive to light. No scleral icterus.  Cardiovascular: Normal rate, regular rhythm and normal heart sounds.   Pulmonary/Chest: Effort normal and breath sounds normal.  Abdominal: Soft. Bowel sounds are normal. There is no tenderness.  Musculoskeletal: She exhibits no edema.  Neurological: She is alert and oriented to person, place, and time.  Skin: No rash noted.    ED Course  Procedures (including critical care time)  DIAGNOSTIC STUDIES: Oxygen Saturation is 98% on RA, normal by my interpretation.    COORDINATION OF CARE: 7:52 PM- Discussed plans to order diagnostic lab work and imaging of her pelvis. Pt advised of plan for treatment and pt agrees.   Medications - No data to display  Results for orders placed during the hospital encounter of 08/04/14  URINALYSIS, ROUTINE W REFLEX MICROSCOPIC      Result Value Ref Range   Color, Urine YELLOW  YELLOW   APPearance CLOUDY (*) CLEAR   Specific Gravity, Urine 1.024  1.005 - 1.030   pH 6.0  5.0 - 8.0   Glucose, UA NEGATIVE  NEGATIVE mg/dL   Hgb urine dipstick LARGE (*) NEGATIVE   Bilirubin Urine NEGATIVE  NEGATIVE   Ketones, ur NEGATIVE  NEGATIVE mg/dL   Protein, ur 30 (*) NEGATIVE mg/dL   Urobilinogen, UA 0.2  0.0 - 1.0 mg/dL   Nitrite NEGATIVE  NEGATIVE   Leukocytes, UA NEGATIVE  NEGATIVE  PREGNANCY, URINE      Result Value Ref Range   Preg Test, Ur NEGATIVE  NEGATIVE  URINE MICROSCOPIC-ADD ON      Result  Value Ref Range   Squamous Epithelial / LPF FEW (*) RARE   RBC / HPF TOO NUMEROUS TO COUNT  <3 RBC/hpf   Bacteria, UA FEW (*) RARE  CBC WITH DIFFERENTIAL      Result Value Ref Range   WBC 7.3  4.0 - 10.5 K/uL   RBC 4.73  3.87 - 5.11 MIL/uL   Hemoglobin 13.1  12.0 - 15.0 g/dL   HCT 09.8  11.9 - 14.7 %   MCV 85.0  78.0 - 100.0 fL   MCH 27.7  26.0 - 34.0 pg   MCHC 32.6  30.0 - 36.0 g/dL   RDW 82.9  56.2 - 13.0 %   Platelets 506 (*) 150 - 400 K/uL   Neutrophils Relative % 59  43 - 77 %   Neutro Abs 4.3  1.7 - 7.7 K/uL   Lymphocytes Relative 30  12 -  46 %   Lymphs Abs 2.2  0.7 - 4.0 K/uL   Monocytes Relative 9  3 - 12 %   Monocytes Absolute 0.6  0.1 - 1.0 K/uL   Eosinophils Relative 2  0 - 5 %   Eosinophils Absolute 0.1  0.0 - 0.7 K/uL   Basophils Relative 0  0 - 1 %   Basophils Absolute 0.0  0.0 - 0.1 K/uL  BASIC METABOLIC PANEL      Result Value Ref Range   Sodium 139  137 - 147 mEq/L   Potassium 4.0  3.7 - 5.3 mEq/L   Chloride 102  96 - 112 mEq/L   CO2 25  19 - 32 mEq/L   Glucose, Bld 102 (*) 70 - 99 mg/dL   BUN 11  6 - 23 mg/dL   Creatinine, Ser 1.61  0.50 - 1.10 mg/dL   Calcium 09.6  8.4 - 04.5 mg/dL   GFR calc non Af Amer 86 (*) >90 mL/min   GFR calc Af Amer >90  >90 mL/min   Anion gap 12  5 - 15   Results for orders placed during the hospital encounter of 08/04/14  URINALYSIS, ROUTINE W REFLEX MICROSCOPIC      Result Value Ref Range   Color, Urine YELLOW  YELLOW   APPearance CLOUDY (*) CLEAR   Specific Gravity, Urine 1.024  1.005 - 1.030   pH 6.0  5.0 - 8.0   Glucose, UA NEGATIVE  NEGATIVE mg/dL   Hgb urine dipstick LARGE (*) NEGATIVE   Bilirubin Urine NEGATIVE  NEGATIVE   Ketones, ur NEGATIVE  NEGATIVE mg/dL   Protein, ur 30 (*) NEGATIVE mg/dL   Urobilinogen, UA 0.2  0.0 - 1.0 mg/dL   Nitrite NEGATIVE  NEGATIVE   Leukocytes, UA NEGATIVE  NEGATIVE  PREGNANCY, URINE      Result Value Ref Range   Preg Test, Ur NEGATIVE  NEGATIVE  URINE MICROSCOPIC-ADD ON       Result Value Ref Range   Squamous Epithelial / LPF FEW (*) RARE   RBC / HPF TOO NUMEROUS TO COUNT  <3 RBC/hpf   Bacteria, UA FEW (*) RARE  CBC WITH DIFFERENTIAL      Result Value Ref Range   WBC 7.3  4.0 - 10.5 K/uL   RBC 4.73  3.87 - 5.11 MIL/uL   Hemoglobin 13.1  12.0 - 15.0 g/dL   HCT 40.9  81.1 - 91.4 %   MCV 85.0  78.0 - 100.0 fL   MCH 27.7  26.0 - 34.0 pg   MCHC 32.6  30.0 - 36.0 g/dL   RDW 78.2  95.6 - 21.3 %   Platelets 506 (*) 150 - 400 K/uL   Neutrophils Relative % 59  43 - 77 %   Neutro Abs 4.3  1.7 - 7.7 K/uL   Lymphocytes Relative 30  12 - 46 %   Lymphs Abs 2.2  0.7 - 4.0 K/uL   Monocytes Relative 9  3 - 12 %   Monocytes Absolute 0.6  0.1 - 1.0 K/uL   Eosinophils Relative 2  0 - 5 %   Eosinophils Absolute 0.1  0.0 - 0.7 K/uL   Basophils Relative 0  0 - 1 %   Basophils Absolute 0.0  0.0 - 0.1 K/uL  BASIC METABOLIC PANEL      Result Value Ref Range   Sodium 139  137 - 147 mEq/L   Potassium 4.0  3.7 - 5.3 mEq/L   Chloride  102  96 - 112 mEq/L   CO2 25  19 - 32 mEq/L   Glucose, Bld 102 (*) 70 - 99 mg/dL   BUN 11  6 - 23 mg/dL   Creatinine, Ser 7.82  0.50 - 1.10 mg/dL   Calcium 95.6  8.4 - 21.3 mg/dL   GFR calc non Af Amer 86 (*) >90 mL/min   GFR calc Af Amer >90  >90 mL/min   Anion gap 12  5 - 15   US Transvaginal Non-ob  08/04/2014   CLINICAL DATA:  Abnormal persistent vaginal bleeding for 23 days.  EXAM: TRANSABDOMINAL AND TRANSVAGINAL ULTRASOUND OF PELVIS  TECHNIQUE: Both transabdominal and transvaginal ultrasound examinations of the pelvis were performed. Transabdominal technique was performed for global imaging of the pelvis including uterus, ovaries, adnexal regions, and pelvic cul-de-sac. It was necessary to proceed with endovaginal exam following the transabdominal exam to visualize the uterus, endometrium, and right ovary.  COMPARISON:  CT scan dated 07/20/2013  FINDINGS: Uterus  Measurements: 8.0 x 5.0 x 6.4 cm. The uterus is retroverted and slightly  heterogeneous. There is a 3 cm fibroid in the right side of the uterine body in a 2.5 cm fibroid in the right side of the uterine fundus.  Endometrium  Thickness: 19 mm. The endometrium is heterogeneous without a visible mass.  Right ovary  Measurements: 4.5 x 2.3 x 3.2 cm. 18 mm simple cyst.  Left ovary  Measurements: 3.5 x 2.3 x 2.3 cm. Normal appearance/no adnexal mass. Multiple small follicles.  Other findings  Small amount of free fluid in the pelvis.  IMPRESSION: Two small fibroids in the uterus neither of which is submucosal.  Prominent endometrium which appears heterogeneous, probably due to blood clots within the endometrial cavity. No appreciable masses in the endometrial cavity.   Electronically Signed   By: Geanie Cooley M.D.   On: 08/04/2014 21:50   US Pelvis Complete  08/04/2014   CLINICAL DATA:  Abnormal persistent vaginal bleeding for 23 days.  EXAM: TRANSABDOMINAL AND TRANSVAGINAL ULTRASOUND OF PELVIS  TECHNIQUE: Both transabdominal and transvaginal ultrasound examinations of the pelvis were performed. Transabdominal technique was performed for global imaging of the pelvis including uterus, ovaries, adnexal regions, and pelvic cul-de-sac. It was necessary to proceed with endovaginal exam following the transabdominal exam to visualize the uterus, endometrium, and right ovary.  COMPARISON:  CT scan dated 07/20/2013  FINDINGS: Uterus  Measurements: 8.0 x 5.0 x 6.4 cm. The uterus is retroverted and slightly heterogeneous. There is a 3 cm fibroid in the right side of the uterine body in a 2.5 cm fibroid in the right side of the uterine fundus.  Endometrium  Thickness: 19 mm. The endometrium is heterogeneous without a visible mass.  Right ovary  Measurements: 4.5 x 2.3 x 3.2 cm. 18 mm simple cyst.  Left ovary  Measurements: 3.5 x 2.3 x 2.3 cm. Normal appearance/no adnexal mass. Multiple small follicles.  Other findings  Small amount of free fluid in the pelvis.  IMPRESSION: Two small fibroids in the  uterus neither of which is submucosal.  Prominent endometrium which appears heterogeneous, probably due to blood clots within the endometrial cavity. No appreciable masses in the endometrial cavity.   Electronically Signed   By: Geanie Cooley M.D.   On: 08/04/2014 21:50     No results found.   MDM   Final diagnoses:  Vaginal bleeding    Patient with some dysfunctional uterine bleeding. No significant vital sign abnormalities or drop in hemoglobin  and hematocrit. Patient's had this problem before has been treated with Megace. Patient is followed by OB/GYN. Will start Megace 40 mg twice a day for the next 7 days. Followup with her OB/GYN doctor will be important patient understands this. Patient will return for any new or worse symptoms. No significant abdominal findings on exam or on pelvic ultrasound.   Selena Mulders, MD 08/04/14 2350

## 2014-08-04 NOTE — ED Notes (Signed)
Pt. Reports she started her "period"  On August 2nd and has not stopped bleeding.  Pt. Is using pads with small blood clots 3 days.  [facility] Last week due to no money for co - pay.  Pt. Reports lower back pain also.

## 2014-08-04 NOTE — Discharge Instructions (Signed)
Abnormal Uterine Bleeding Abnormal uterine bleeding can affect women at various stages in life, including teenagers, women in their reproductive years, pregnant women, and women who have reached menopause. Several kinds of uterine bleeding are considered abnormal, including:  Bleeding or spotting between periods.   Bleeding after sexual intercourse.   Bleeding that is heavier or more than normal.   Periods that last longer than usual.  Bleeding after menopause.  Many cases of abnormal uterine bleeding are minor and simple to treat, while others are more serious. Any type of abnormal bleeding should be evaluated by your health care provider. Treatment will depend on the cause of the bleeding. HOME CARE INSTRUCTIONS Monitor your condition for any changes. The following actions may help to alleviate any discomfort you are experiencing:  Avoid the use of tampons and douches as directed by your health care provider.  Change your pads frequently. You should get regular pelvic exams and Pap tests. Keep all follow-up appointments for diagnostic tests as directed by your health care provider.  SEEK MEDICAL CARE IF:   Your bleeding lasts more than 1 week.   You feel dizzy at times.  SEEK IMMEDIATE MEDICAL CARE IF:   You pass out.   You are changing pads every 15 to 30 minutes.   You have abdominal pain.  You have a fever.   You become sweaty or weak.   You are passing large blood clots from the vagina.   You start to feel nauseous and vomit. MAKE SURE YOU:   Understand these instructions.  Will watch your condition.  Will get help right away if you are not doing well or get worse. Document Released: 11/27/2005 Document Revised: 12/02/2013 Document Reviewed: 06/26/2013 [facility] Patient Information 2015 [place], [place]. This information is not intended to replace advice given to you by your health care provider. Make sure you discuss any questions you have with your  health care provider.  Take the Megace as directed. Make a plan to follow up with your Dr. Prescription provided. Return for any new or worse symptoms. Followup with your OB/GYN as we discussed would be important.

## 2014-09-18 ENCOUNTER — Encounter (HOSPITAL_BASED_OUTPATIENT_CLINIC_OR_DEPARTMENT_OTHER): Payer: Self-pay | Admitting: Emergency Medicine

## 2014-09-18 ENCOUNTER — Emergency Department (HOSPITAL_BASED_OUTPATIENT_CLINIC_OR_DEPARTMENT_OTHER)
Admission: EM | Admit: 2014-09-18 | Discharge: 2014-09-18 | Disposition: A | Discharge status: Home / Self Care | Payer: Medicaid Other | Attending: Emergency Medicine | Admitting: Emergency Medicine

## 2014-09-18 DIAGNOSIS — M791 Myalgia: Secondary | ICD-10-CM | POA: Insufficient documentation

## 2014-09-18 DIAGNOSIS — R110 Nausea: Secondary | ICD-10-CM | POA: Diagnosis not present

## 2014-09-18 DIAGNOSIS — Z79899 Other long term (current) drug therapy: Secondary | ICD-10-CM | POA: Insufficient documentation

## 2014-09-18 DIAGNOSIS — J029 Acute pharyngitis, unspecified: Secondary | ICD-10-CM | POA: Diagnosis not present

## 2014-09-18 DIAGNOSIS — J069 Acute upper respiratory infection, unspecified: Secondary | ICD-10-CM | POA: Insufficient documentation

## 2014-09-18 LAB — RAPID STREP SCREEN (MED CTR MEBANE ONLY): Streptococcus, Group A Screen (Direct): NEGATIVE

## 2014-09-18 MED ORDER — BENZONATATE 100 MG PO CAPS: 100.0000 mg | ORAL_CAPSULE | Freq: Three times a day (TID) | ORAL | Status: DC

## 2014-09-18 MED ORDER — IBUPROFEN 800 MG PO TABS: 800.0000 mg | ORAL_TABLET | Freq: Three times a day (TID) | ORAL | Status: DC

## 2014-09-18 NOTE — Discharge Instructions (Signed)
Cough, Adult  A cough is a reflex that helps clear your throat and airways. It can help heal the body or may be a reaction to an irritated airway. A cough may only last 2 or 3 weeks (acute) or may last more than 8 weeks (chronic).  CAUSES Acute cough:  Viral or bacterial infections. Chronic cough:  Infections.  Allergies.  Asthma.  Post-nasal drip.  Smoking.  Heartburn or acid reflux.  Some medicines.  Chronic lung problems (COPD).  Cancer. SYMPTOMS   Cough.  Fever.  Chest pain.  Increased breathing rate.  High-pitched whistling sound when breathing (wheezing).  Colored mucus that you cough up (sputum). TREATMENT   A bacterial cough may be treated with antibiotic medicine.  A viral cough must run its course and will not respond to antibiotics.  Your caregiver may recommend other treatments if you have a chronic cough. HOME CARE INSTRUCTIONS   Only take over-the-counter or prescription medicines for pain, discomfort, or fever as directed by your caregiver. Use cough suppressants only as directed by your caregiver.  Use a cold steam vaporizer or humidifier in your bedroom or home to help loosen secretions.  Sleep in a semi-upright position if your cough is worse at night.  Rest as needed.  Stop smoking if you smoke. SEEK IMMEDIATE MEDICAL CARE IF:   You have pus in your sputum.  Your cough starts to worsen.  You cannot control your cough with suppressants and are losing sleep.  You begin coughing up blood.  You have difficulty breathing.  You develop pain which is getting worse or is uncontrolled with medicine.  You have a fever. MAKE SURE YOU:   Understand these instructions.  Will watch your condition.  Will get help right away if you are not doing well or get worse. Document Released: 05/26/2011 Document Revised: 02/19/2012 Document Reviewed: 05/26/2011 [employer] Patient Information 2015 [employer]. This information is not intended  to replace advice given to you by your health care provider. Make sure you discuss any questions you have with your health care provider.  

## 2014-09-18 NOTE — ED Notes (Signed)
PA at bedside.

## 2014-09-18 NOTE — ED Notes (Signed)
Sore throat, cough with clear sputum, stuffy nose and chills x 5 days.

## 2014-09-18 NOTE — ED Provider Notes (Signed)
CSN: [id number]     Arrival date & time 09/18/14  1554 History   First MD Initiated Contact with Patient 09/18/14 1603     Chief Complaint  Patient presents with  . Sore Throat     (Consider location/radiation/quality/duration/timing/severity/associated sxs/prior Treatment) Patient is a 28 y.o. female presenting with pharyngitis. The history is provided by the patient. No language interpreter was used.  Sore Throat This is a new problem. The current episode started today. The problem occurs constantly. The problem has been unchanged. Associated symptoms include a fever, myalgias, nausea and a sore throat. Nothing aggravates the symptoms. She has tried nothing for the symptoms. The treatment provided no relief.  Pt complains of a sore throat for the past 5 days  History reviewed. No pertinent past medical history. Past Surgical History  Procedure Laterality Date  . Cesarean section    . Laparoscopy     No family history on file. History  Substance Use Topics  . Smoking status: Never Smoker   . Smokeless tobacco: Never Used  . Alcohol Use: No   OB History   Grav Para Term Preterm Abortions TAB SAB Ect Mult Living                 Review of Systems  Constitutional: Positive for fever.  HENT: Positive for sore throat.   Gastrointestinal: Positive for nausea.  Musculoskeletal: Positive for myalgias.  All other systems reviewed and are negative.     Allergies  Review of patient's allergies indicates no known allergies.  Home Medications   Prior to Admission medications   Medication Sig Start Date End Date Taking? Authorizing Provider  benzonatate (TESSALON) 100 MG capsule Take 1 capsule (100 mg total) by mouth every 8 (eight) hours. 01/02/14   Shari A Upstill, PA-C  esomeprazole (NEXIUM) 40 MG capsule Take 1 capsule (40 mg total) by mouth daily. 02/05/14   Richardean Canalavid H Yao, MD  famotidine (PEPCID) 20 MG tablet Take 1 tablet (20 mg total) by mouth 2 (two) times daily as needed  for heartburn or indigestion. 02/05/14   Richardean Canalavid H Yao, MD  megestrol (MEGACE) 40 MG tablet Take 1 tablet (40 mg total) by mouth 2 (two) times daily. 08/04/14   Vanetta MuldersScott Zackowski, MD  ondansetron (ZOFRAN) 4 MG tablet Take 1 tablet (4 mg total) by mouth every 6 (six) hours. 01/02/14   Shari A Upstill, PA-C  Phenyleph-Promethazine-Cod 5-6.25-10 MG/5ML SYRP Take 5 mLs by mouth every 6 (six) hours as needed. 12/21/13   Hope Orlene OchM Neese, NP   BP 154/94  Pulse 107  Temp(Src) 99.6 F (37.6 C) (Oral)  Resp 20  Ht 5\' 6"  (1.676 m)  Wt 270 lb (122.471 kg)  BMI 43.60 kg/m2  SpO2 98% Physical Exam  Nursing note and vitals reviewed. Constitutional: She is oriented to person, place, and time. She appears well-developed and well-nourished.  HENT:  Head: Normocephalic and atraumatic.  Nasal congestion  Eyes: Conjunctivae and EOM are normal. Pupils are equal, round, and reactive to light.  Neck: Normal range of motion.  Cardiovascular: Normal rate and normal heart sounds.   Pulmonary/Chest: Effort normal.  Abdominal: She exhibits no distension.  Musculoskeletal: Normal range of motion.  Neurological: She is alert and oriented to person, place, and time.  Skin: Skin is warm.  Psychiatric: She has a normal mood and affect.    ED Course  Procedures (including critical care time) Labs Review Labs Reviewed  RAPID STREP SCREEN    Imaging Review No results  found.   EKG Interpretation None      MDM   Final diagnoses:  URI (upper respiratory infection)  Pharyngitis    Ibuprofen Tessalon AVS   Elson AreasLeslie K Lataysha Vohra, PA-C 09/18/14 1654

## 2014-09-20 LAB — CULTURE, GROUP A STREP

## 2014-09-22 NOTE — ED Provider Notes (Signed)
History/physical exam/procedure(s) were performed by non-physician practitioner and as supervising physician I was immediately available for consultation/collaboration. I have reviewed all notes and am in agreement with care and plan.   Jacquelynn Friend S Janeese Mcgloin, MD 09/22/14 1515 

## 2014-10-22 ENCOUNTER — Emergency Department (HOSPITAL_BASED_OUTPATIENT_CLINIC_OR_DEPARTMENT_OTHER)
Admission: EM | Admit: 2014-10-22 | Discharge: 2014-10-22 | Discharge status: Against Medical Advice | Payer: Medicaid Other | Attending: Emergency Medicine | Admitting: Emergency Medicine

## 2014-10-22 ENCOUNTER — Encounter (HOSPITAL_BASED_OUTPATIENT_CLINIC_OR_DEPARTMENT_OTHER): Payer: Self-pay

## 2014-10-22 DIAGNOSIS — M549 Dorsalgia, unspecified: Secondary | ICD-10-CM | POA: Insufficient documentation

## 2014-10-22 DIAGNOSIS — R42 Dizziness and giddiness: Secondary | ICD-10-CM | POA: Insufficient documentation

## 2014-10-22 DIAGNOSIS — R079 Chest pain, unspecified: Secondary | ICD-10-CM | POA: Diagnosis present

## 2014-10-22 HISTORY — DX: Gastro-esophageal reflux disease without esophagitis: K21.9

## 2014-10-22 NOTE — ED Notes (Signed)
CP intermittent x 1 month-denies s/s except some light headedness and back pain

## 2014-10-22 NOTE — ED Notes (Signed)
EKG was reviewed by EDP-no orders received

## 2014-12-07 ENCOUNTER — Encounter (HOSPITAL_BASED_OUTPATIENT_CLINIC_OR_DEPARTMENT_OTHER): Payer: Self-pay

## 2014-12-07 ENCOUNTER — Emergency Department (HOSPITAL_BASED_OUTPATIENT_CLINIC_OR_DEPARTMENT_OTHER)
Admission: EM | Admit: 2014-12-07 | Discharge: 2014-12-07 | Disposition: A | Discharge status: Home / Self Care | Payer: Medicaid Other | Attending: Emergency Medicine | Admitting: Emergency Medicine

## 2014-12-07 DIAGNOSIS — L729 Follicular cyst of the skin and subcutaneous tissue, unspecified: Secondary | ICD-10-CM | POA: Insufficient documentation

## 2014-12-07 DIAGNOSIS — Z8719 Personal history of other diseases of the digestive system: Secondary | ICD-10-CM | POA: Diagnosis not present

## 2014-12-07 NOTE — ED Notes (Signed)
Small "knot" to left side of neck x 2 days-denies injury/fevers

## 2014-12-07 NOTE — Discharge Instructions (Signed)
Epidermal Cyst An epidermal cyst is sometimes called a sebaceous cyst, epidermal inclusion cyst, or infundibular cyst. These cysts usually contain a substance that looks "pasty" or "cheesy" and may have a bad smell. This substance is a protein called keratin. Epidermal cysts are usually found on the face, neck, or trunk. They may also occur in the vaginal area or other parts of the genitalia of both men and women. Epidermal cysts are usually small, painless, slow-growing bumps or lumps that move freely under the skin. It is important not to try to pop them. This may cause an infection and lead to tenderness and swelling. CAUSES  Epidermal cysts may be caused by a deep penetrating injury to the skin or a plugged hair follicle, often associated with acne. SYMPTOMS  Epidermal cysts can become inflamed and cause:  Redness.  Tenderness.  Increased temperature of the skin over the bumps or lumps.  Grayish-white, bad smelling material that drains from the bump or lump. DIAGNOSIS  Epidermal cysts are easily diagnosed by your caregiver during an exam. Rarely, a tissue sample (biopsy) may be taken to rule out other conditions that may resemble epidermal cysts. TREATMENT   Epidermal cysts often get better and disappear on their own. They are rarely ever cancerous.  If a cyst becomes infected, it may become inflamed and tender. This may require opening and draining the cyst. Treatment with antibiotics may be necessary. When the infection is gone, the cyst may be removed with minor surgery.  Small, inflamed cysts can often be treated with antibiotics or by injecting steroid medicines.  Sometimes, epidermal cysts become large and bothersome. If this happens, surgical removal in your caregiver's office may be necessary. HOME CARE INSTRUCTIONS  Only take over-the-counter or prescription medicines as directed by your caregiver.  Take your antibiotics as directed. Finish them even if you start to feel  better. SEEK MEDICAL CARE IF:   Your cyst becomes tender, red, or swollen.  Your condition is not improving or is getting worse.  You have any other questions or concerns. MAKE SURE YOU:  Understand these instructions.  Will watch your condition.  Will get help right away if you are not doing well or get worse. Document Released: 10/28/2004 Document Revised: 02/19/2012 Document Reviewed: 06/05/2011 Exit[employer] Patient Information 2015 [employer]. This information is not intended to replace advice given to you by your health care provider. Make sure you discuss any questions you have with your health care provider.  

## 2014-12-07 NOTE — ED Provider Notes (Signed)
CSN: [id number]     Arrival date & time 12/07/14  [id number] History  This chart was scribed for Selena Shiobert L Joelie Schou, MD by Selena Boyd, ED Scribe. This patient was seen in room MH01/MH01 and the patient's care was started at 7:41 PM.    Chief Complaint  Patient presents with  . Cyst    The history is provided by the patient. No language interpreter was used.   HPI Comments: Selena Boyd is a 28 y.o. female who presents to the Emergency Department complaining of lump on the left side of her neck with onset 2 days ago.  She notes associated tenderness to the area. Pt has her ears pierced but has not worn earrings in weeks. She notes she usually wears them for a day before removing them. Pt has extensions glued into her hairline. Pt denies toothache and sore throat.     Past Medical History  Diagnosis Date  . GERD (gastroesophageal reflux disease)    Past Surgical History  Procedure Laterality Date  . Cesarean section    . Laparoscopy     No family history on file. History  Substance Use Topics  . Smoking status: Never Smoker   . Smokeless tobacco: Never Used  . Alcohol Use: No   OB History    No data available     Review of Systems  Skin:       Small pustule  All other systems reviewed and are negative.     Allergies  Review of patient's allergies indicates no known allergies.  Home Medications   Prior to Admission medications   Not on File   BP 112/83 mmHg  Pulse 102  Temp(Src) 98.8 F (37.1 C) (Oral)  Resp 18  Ht 5\' 6"  (1.676 m)  Wt 274 lb (124.286 kg)  BMI 44.25 kg/m2  SpO2 100%  LMP 11/10/2014 Physical Exam Physical Exam  Nursing note and vitals reviewed. Constitutional: She is oriented to person, place, and time. She appears well-developed and well-nourished. No distress.  HENT:  Head: Normocephalic and atraumatic.  Eyes: Pupils are equal, round, and reactive to light.  Neck: Normal range of motion.  small skin cyst noted left cervical area.  Small  amount of purulent drainage with palpation.  No underlying cervical adenopathy noted.  No source of infection noted on scalp or left neck area. Cardiovascular: Normal rate and intact distal pulses.   Pulmonary/Chest: No respiratory distress.  Abdominal: Normal appearance. She exhibits no distension.  Musculoskeletal: Normal range of motion.  Neurological: She is alert and oriented to person, place, and time. No cranial nerve deficit.  Skin: Skin is warm and dry. No rash noted.  Psychiatric: She has a normal mood and affect. Her behavior is normal.   ED Course  Procedures (including critical care time) DIAGNOSTIC STUDIES: Oxygen Saturation is 100% on room air, normal by my interpretation.    COORDINATION OF CARE: 7:49 PM Discussed treatment plan with patient at beside, the patient agrees with the plan and has no further questions at this time.   Instructed the patient on warm compresses keeping clean with Neosporin/Polysporin.  Will return to emergency department if it enlarges and needs to be drained but at this time does not need any incision.   MDM   Final diagnoses:  Skin cyst    I personally performed the services described in this documentation, which was scribed in my presence. The recorded information has been reviewed and considered.    Selena Shiobert L Lakecia Deschamps, MD 12/07/14  2005 

## 2015-01-07 ENCOUNTER — Encounter (HOSPITAL_BASED_OUTPATIENT_CLINIC_OR_DEPARTMENT_OTHER): Payer: Self-pay | Admitting: *Deleted

## 2015-01-07 ENCOUNTER — Emergency Department (HOSPITAL_BASED_OUTPATIENT_CLINIC_OR_DEPARTMENT_OTHER)
Admission: EM | Admit: 2015-01-07 | Discharge: 2015-01-07 | Disposition: A | Discharge status: Home / Self Care | Payer: Medicaid Other | Attending: Emergency Medicine | Admitting: Emergency Medicine

## 2015-01-07 ENCOUNTER — Emergency Department (HOSPITAL_BASED_OUTPATIENT_CLINIC_OR_DEPARTMENT_OTHER): Payer: Medicaid Other

## 2015-01-07 DIAGNOSIS — J329 Chronic sinusitis, unspecified: Secondary | ICD-10-CM | POA: Diagnosis not present

## 2015-01-07 DIAGNOSIS — Z8719 Personal history of other diseases of the digestive system: Secondary | ICD-10-CM | POA: Insufficient documentation

## 2015-01-07 DIAGNOSIS — R05 Cough: Secondary | ICD-10-CM | POA: Diagnosis present

## 2015-01-07 MED ORDER — FLUTICASONE PROPIONATE 50 MCG/ACT NA SUSP: 2.0000 | Freq: Every day | NASAL | Status: DC

## 2015-01-07 NOTE — ED Notes (Signed)
Reports coughing up blood clot this morning when she cleared her throat when she woke up. Denies pain, sts no hx of such.

## 2015-01-07 NOTE — Discharge Instructions (Signed)
You may use over-the-counter nasal saline spray multiple times a day to keep your sinuses moist. You may also keep a humidifier in your room. Your chest x-ray today was normal. I feel this episode of bloody mucus came from your sinuses and not from your lungs.   Sinusitis Sinusitis is redness, soreness, and inflammation of the paranasal sinuses. Paranasal sinuses are air pockets within the bones of your face (beneath the eyes, the middle of the forehead, or above the eyes). In healthy paranasal sinuses, mucus is able to drain out, and air is able to circulate through them by way of your nose. However, when your paranasal sinuses are inflamed, mucus and air can become trapped. This can allow bacteria and other germs to grow and cause infection. Sinusitis can develop quickly and last only a short time (acute) or continue over a long period (chronic). Sinusitis that lasts for more than 12 weeks is considered chronic.  CAUSES  Causes of sinusitis include:  Allergies.  Structural abnormalities, such as displacement of the cartilage that separates your nostrils (deviated septum), which can decrease the air flow through your nose and sinuses and affect sinus drainage.  Functional abnormalities, such as when the small hairs (cilia) that line your sinuses and help remove mucus do not work properly or are not present. SIGNS AND SYMPTOMS  Symptoms of acute and chronic sinusitis are the same. The primary symptoms are pain and pressure around the affected sinuses. Other symptoms include:  Upper toothache.  Earache.  Headache.  Bad breath.  Decreased sense of smell and taste.  A cough, which worsens when you are lying flat.  Fatigue.  Fever.  Thick drainage from your nose, which often is green and may contain pus (purulent).  Swelling and warmth over the affected sinuses. DIAGNOSIS  Your health care provider will perform a physical exam. During the exam, your health care provider may:  Look  in your nose for signs of abnormal growths in your nostrils (nasal polyps).  Tap over the affected sinus to check for signs of infection.  View the inside of your sinuses (endoscopy) using an imaging device that has a light attached (endoscope). If your health care provider suspects that you have chronic sinusitis, one or more of the following tests may be recommended:  Allergy tests.  Nasal culture. A sample of mucus is taken from your nose, sent to a lab, and screened for bacteria.  Nasal cytology. A sample of mucus is taken from your nose and examined by your health care provider to determine if your sinusitis is related to an allergy. TREATMENT  Most cases of acute sinusitis are related to a viral infection and will resolve on their own within 10 days. Sometimes medicines are prescribed to help relieve symptoms (pain medicine, decongestants, nasal steroid sprays, or saline sprays).  However, for sinusitis related to a bacterial infection, your health care provider will prescribe antibiotic medicines. These are medicines that will help kill the bacteria causing the infection.  Rarely, sinusitis is caused by a fungal infection. In theses cases, your health care provider will prescribe antifungal medicine. For some cases of chronic sinusitis, surgery is needed. Generally, these are cases in which sinusitis recurs more than 3 times per year, despite other treatments. HOME CARE INSTRUCTIONS   Drink plenty of water. Water helps thin the mucus so your sinuses can drain more easily.  Use a humidifier.  Inhale steam 3 to 4 times a day (for example, sit in the bathroom with the  shower running).  Apply a warm, moist washcloth to your face 3 to 4 times a day, or as directed by your health care provider.  Use saline nasal sprays to help moisten and clean your sinuses.  Take medicines only as directed by your health care provider.  If you were prescribed either an antibiotic or antifungal  medicine, finish it all even if you start to feel better. SEEK IMMEDIATE MEDICAL CARE IF:  You have increasing pain or severe headaches.  You have nausea, vomiting, or drowsiness.  You have swelling around your face.  You have vision problems.  You have a stiff neck.  You have difficulty breathing. MAKE SURE YOU:   Understand these instructions.  Will watch your condition.  Will get help right away if you are not doing well or get worse. Document Released: 11/27/2005 Document Revised: 04/13/2014 Document Reviewed: 12/12/2011 [facility] Patient Information 2015 [place], [place]. This information is not intended to replace advice given to you by your health care provider. Make sure you discuss any questions you have with your health care provider.

## 2015-01-07 NOTE — ED Notes (Signed)
Pt c/o cough x1 week that had been non-productive. This morning she had 1 incidence of coughing up a small amount of blood.

## 2015-01-07 NOTE — ED Provider Notes (Signed)
TIME SEEN: 11:35 AM  CHIEF COMPLAINT: Bloody nasal congestion  HPI: Pt is a 29 y.o. female with history of GERD who states that she snorted this AM trying to clear her sinuses and sucked in a blood clot from her sinuses this morning. Denies that she coughs this up. Denies any sore throat. No chest pain or shortness of breath. Has had a week of nonproductive cough. No fever. No history of PE or DVT. No vomiting or diarrhea. Not on anticoagulation.  ROS: See HPI Constitutional: no fever  Eyes: no drainage  ENT:  runny nose   Cardiovascular:  no chest pain  Resp: no SOB  GI: no vomiting GU: no dysuria Integumentary: no rash  Allergy: no hives  Musculoskeletal: no leg swelling  Neurological: no slurred speech ROS otherwise negative  PAST MEDICAL HISTORY/PAST SURGICAL HISTORY:  Past Medical History  Diagnosis Date  . GERD (gastroesophageal reflux disease)     MEDICATIONS:  Prior to Admission medications   Not on File    ALLERGIES:  No Known Allergies  SOCIAL HISTORY:  History  Substance Use Topics  . Smoking status: Never Smoker   . Smokeless tobacco: Never Used  . Alcohol Use: No    FAMILY HISTORY: No family history on file.  EXAM: BP 141/81 mmHg  Temp(Src) 98.3 F (36.8 C) (Oral)  Resp 20  Ht 5\' 6"  (1.676 m)  Wt 270 lb (122.471 kg)  BMI 43.60 kg/m2  SpO2 98%  LMP 12/11/2014 CONSTITUTIONAL: Alert and oriented and responds appropriately to questions. Well-appearing; well-nourished HEAD: Normocephalic EYES: Conjunctivae clear, PERRL ENT: normal nose; no rhinorrhea; moist mucous membranes; pharynx without lesions noted, turbinates are mildly inflamed bilaterally, no active bleeding in the ears or in the posterior oropharynx NECK: Supple, no meningismus, no LAD  CARD: RRR; S1 and S2 appreciated; no murmurs, no clicks, no rubs, no gallops RESP: Normal chest excursion without splinting or tachypnea; breath sounds clear and equal bilaterally; no wheezes, no rhonchi,  no rales, no hypoxia or respiratory distress ABD/GI: Normal bowel sounds; non-distended; soft, non-tender, no rebound, no guarding BACK:  The back appears normal and is non-tender to palpation, there is no CVA tenderness EXT: Normal ROM in all joints; non-tender to palpation; no edema; normal capillary refill; no cyanosis    SKIN: Normal color for age and race; warm NEURO: Moves all extremities equally PSYCH: The patient's mood and manner are appropriate. Grooming and personal hygiene are appropriate.  MEDICAL DECISION MAKING: Patient here with a blood clot that appears to have, out of her sinuses. She did not cough this up and has no hemoptysis. Lungs are completely clear and she is very well-appearing with no chest pain or shortness of breath. She is PERC negative. Chest x-ray is clear. We'll discharge him on Flonase to use that she does have inflamed nasal turbinates bilaterally. Discussed with patient using a humidifier in her room and over-the-counter nasal saline given the areas dryness may be contributing to her bloody sinus this morning. No active nosebleed currently. I feel she is safe to be discharged home. Discussed return precautions. She verbalized understanding and is comfortable with plan.   Layla MawKristen N Bonifacio Pruden, DO 01/07/15 1146

## 2015-01-07 NOTE — ED Notes (Signed)
MD at bedside. 

## 2015-01-31 ENCOUNTER — Emergency Department (HOSPITAL_BASED_OUTPATIENT_CLINIC_OR_DEPARTMENT_OTHER): Payer: Medicaid Other

## 2015-01-31 ENCOUNTER — Encounter (HOSPITAL_BASED_OUTPATIENT_CLINIC_OR_DEPARTMENT_OTHER): Payer: Self-pay | Admitting: *Deleted

## 2015-01-31 ENCOUNTER — Emergency Department (HOSPITAL_BASED_OUTPATIENT_CLINIC_OR_DEPARTMENT_OTHER)
Admission: EM | Admit: 2015-01-31 | Discharge: 2015-01-31 | Disposition: A | Discharge status: Home / Self Care | Payer: Medicaid Other | Attending: Emergency Medicine | Admitting: Emergency Medicine

## 2015-01-31 DIAGNOSIS — R040 Epistaxis: Secondary | ICD-10-CM | POA: Insufficient documentation

## 2015-01-31 DIAGNOSIS — Z8719 Personal history of other diseases of the digestive system: Secondary | ICD-10-CM | POA: Insufficient documentation

## 2015-01-31 DIAGNOSIS — Z7951 Long term (current) use of inhaled steroids: Secondary | ICD-10-CM | POA: Insufficient documentation

## 2015-01-31 DIAGNOSIS — R042 Hemoptysis: Secondary | ICD-10-CM

## 2015-01-31 MED ORDER — SALINE SPRAY 0.65 % NA SOLN
1.0000 | Freq: Once | NASAL | Status: AC
  Administered 2015-01-31: 1 via NASAL
  Filled 2015-01-31: qty 44

## 2015-01-31 NOTE — Discharge Instructions (Signed)
As discussed, your evaluation today has been largely reassuring.  But, it is important that you monitor your condition carefully, and do not hesitate to return to the ED if you develop new, or concerning changes in your condition.  Otherwise, please follow-up with your physician for appropriate ongoing care.  Please be sure to use the provided nasal spray 3 times daily for the next week.

## 2015-01-31 NOTE — ED Provider Notes (Signed)
CSN: [id number]     Arrival date & time 01/31/15  [id number] History   First MD Initiated Contact with Patient 01/31/15 [phone]     Chief Complaint  Patient presents with  . Epistaxis     HPI  Patient presents with concern of ongoing episodic epistaxis, hemoptysis. Symptoms have been present for about 2 weeks, occurring intermittently. Today the patient had a typical episode for her, without new syncope, near syncope, chest pain, dyspnea. Patient has been using previously prescribed inhaled steroid as directed. She has not used additional medication for symptom control. There is no ongoing fever, chills, other concerns. Currently the patient has no active bleeding, no active cough, no active complaints.  Past Medical History  Diagnosis Date  . GERD (gastroesophageal reflux disease)    Past Surgical History  Procedure Laterality Date  . Cesarean section    . Laparoscopy     No family history on file. History  Substance Use Topics  . Smoking status: Never Smoker   . Smokeless tobacco: Never Used  . Alcohol Use: No   OB History    No data available     Review of Systems  Constitutional:       Per HPI, otherwise negative  HENT:       Per HPI, otherwise negative  Respiratory:       Per HPI, otherwise negative  Cardiovascular:       Per HPI, otherwise negative  Gastrointestinal: Negative for vomiting.  Endocrine:       Negative aside from HPI  Genitourinary:       Neg aside from HPI   Musculoskeletal:       Per HPI, otherwise negative  Skin: Negative.   Neurological: Negative for syncope.      Allergies  Review of patient's allergies indicates no known allergies.  Home Medications   Prior to Admission medications   Medication Sig Start Date End Date Taking? Authorizing Provider  fluticasone (FLONASE) 50 MCG/ACT nasal spray Place 2 sprays into both nostrils daily. Patient taking differently: Place 2 sprays into both nostrils as needed.  01/07/15   Kristen N Ward, DO    BP 144/93 mmHg  Pulse 86  Temp(Src) 98.6 F (37 C) (Oral)  Resp 18  Ht 5\' 6"  (1.676 m)  Wt 270 lb (122.471 kg)  BMI 43.60 kg/m2  SpO2 100%  LMP 01/13/2015 Physical Exam  Constitutional: She is oriented to person, place, and time. She appears well-developed and well-nourished. No distress.  HENT:  Head: Normocephalic and atraumatic.  Nose:    Eyes: Conjunctivae and EOM are normal.  Cardiovascular: Normal rate and regular rhythm.   Pulmonary/Chest: Effort normal and breath sounds normal. No stridor. No respiratory distress.  Abdominal: She exhibits no distension.  Musculoskeletal: She exhibits no edema.  Neurological: She is alert and oriented to person, place, and time. No cranial nerve deficit.  Skin: Skin is warm and dry.  Psychiatric: She has a normal mood and affect.  Nursing note and vitals reviewed.  Patient requested x-ray ED Course  Procedures (including critical care time)  Imaging Review Dg Chest 2 View  01/31/2015   CLINICAL DATA:  Nosebleed, epistaxis  EXAM: CHEST  2 VIEW  COMPARISON:  01/07/2015  FINDINGS: The heart size and mediastinal contours are within normal limits. Both lungs are clear. The visualized skeletal structures are unremarkable.  IMPRESSION: No active cardiopulmonary disease.   Electronically Signed   By: Judie PetitM.  Shick M.D.   On: 01/31/2015 08:12  I reviewed the x-ray, agree with the interpretation. Patient was provided inhaled nasal spray  MDM  Patient presents with concern of ongoing episodic epistaxis, hemoptysis. Patient has diminished other complaints, including minimal cough, sinus pressure, though there is some ongoing evidence for URI versus sinus irritation. No active bleeding anywhere. Patient's x-ray, performed per request, was unremarkable. Patient was hemodynamically stable. Low suspicion for bacteremia, sepsis, occult pneumonia, substantial blood loss. Patient was started on a course of inhaled nasal therapy, will follow up with  ENT as needed.    Gerhard Munch, MD 01/31/15 [phone]

## 2015-01-31 NOTE — ED Notes (Signed)
Patient states she woke up around 6 am with a nosebleed & was spitting blod out of her mouth, not bleeding at this time. Drainage dorn back of throat during the week.

## 2015-04-19 ENCOUNTER — Emergency Department (HOSPITAL_BASED_OUTPATIENT_CLINIC_OR_DEPARTMENT_OTHER)
Admission: EM | Admit: 2015-04-19 | Discharge: 2015-04-20 | Disposition: A | Discharge status: Home / Self Care | Payer: Medicaid Other | Attending: Emergency Medicine | Admitting: Emergency Medicine

## 2015-04-19 ENCOUNTER — Encounter (HOSPITAL_BASED_OUTPATIENT_CLINIC_OR_DEPARTMENT_OTHER): Payer: Self-pay | Admitting: *Deleted

## 2015-04-19 DIAGNOSIS — R1084 Generalized abdominal pain: Secondary | ICD-10-CM | POA: Insufficient documentation

## 2015-04-19 DIAGNOSIS — Z8719 Personal history of other diseases of the digestive system: Secondary | ICD-10-CM | POA: Insufficient documentation

## 2015-04-19 DIAGNOSIS — R197 Diarrhea, unspecified: Secondary | ICD-10-CM | POA: Diagnosis not present

## 2015-04-19 DIAGNOSIS — R143 Flatulence: Secondary | ICD-10-CM | POA: Insufficient documentation

## 2015-04-19 DIAGNOSIS — IMO0001 Reserved for inherently not codable concepts without codable children: Secondary | ICD-10-CM

## 2015-04-19 DIAGNOSIS — Z3202 Encounter for pregnancy test, result negative: Secondary | ICD-10-CM | POA: Diagnosis not present

## 2015-04-19 DIAGNOSIS — R109 Unspecified abdominal pain: Secondary | ICD-10-CM

## 2015-04-19 DIAGNOSIS — Z9889 Other specified postprocedural states: Secondary | ICD-10-CM | POA: Diagnosis not present

## 2015-04-19 LAB — URINALYSIS, ROUTINE W REFLEX MICROSCOPIC
Bilirubin Urine: NEGATIVE
Glucose, UA: NEGATIVE mg/dL
Hgb urine dipstick: NEGATIVE
Ketones, ur: NEGATIVE mg/dL
Leukocytes, UA: NEGATIVE
NITRITE: NEGATIVE
Protein, ur: NEGATIVE mg/dL
Specific Gravity, Urine: 1.02 (ref 1.005–1.030)
Urobilinogen, UA: 1 mg/dL (ref 0.0–1.0)
pH: 6.5 (ref 5.0–8.0)

## 2015-04-19 LAB — PREGNANCY, URINE: Preg Test, Ur: NEGATIVE

## 2015-04-19 NOTE — ED Provider Notes (Signed)
CSN: [id number]     Arrival date & time 04/19/15  2235 History   First MD Initiated Contact with Patient 04/19/15 2357    This chart was scribed for Jasiri Hanawalt, MD by Marica OtterNusrat Rahman, ED Scribe. This patient was seen in room MH07/MH07 and the patient's care was started at 12:05 AM.  Chief Complaint  Patient presents with  . Abdominal Pain   Patient is a 29 y.o. female presenting with cramps. The history is provided by the patient. No language interpreter was used.  Abdominal Cramping This is a chronic problem. The current episode started more than 1 week ago. The problem occurs constantly. The problem has not changed since onset.Associated symptoms include abdominal pain. Pertinent negatives include no chest pain, no headaches and no shortness of breath. Nothing aggravates the symptoms. Nothing relieves the symptoms. She has tried nothing for the symptoms.   PCP: Rosario AdieBeck, Eric W, MD HPI Comments: Selena Boyd is a 29 y.o. female, with PMH noted below, who presents to the Emergency Department complaining of atraumatic, intermittent generalized, cramping abd pain with associated back pain onset 3 weeks ago at this intensity but more than a year overall. Pt notes bowel movements alleviates her abd pain. Pt also complains of: (1) intermittent diarrhea and (2) frequency; but, denies dysuria. Pt reports having 4 bowel movements a day and having 2-3 meals/day; pt also notes that she drinks soda all day. States she was told she has IBS.  Did not mention this pain to her doctor when he saw her on 04/15/15   Past Medical History  Diagnosis Date  . GERD (gastroesophageal reflux disease)    Past Surgical History  Procedure Laterality Date  . Cesarean section    . Laparoscopy     History reviewed. No pertinent family history. History  Substance Use Topics  . Smoking status: Never Smoker   . Smokeless tobacco: Never Used  . Alcohol Use: No   OB History    No data available     Review of Systems   Constitutional: Negative for fever and chills.  Respiratory: Negative for shortness of breath.   Cardiovascular: Negative for chest pain, palpitations and leg swelling.  Gastrointestinal: Positive for abdominal pain.  Genitourinary: Positive for frequency. Negative for dysuria.  Neurological: Negative for headaches.  Psychiatric/Behavioral: Negative for confusion.  All other systems reviewed and are negative.  Allergies  Review of patient's allergies indicates no known allergies.  Home Medications   Prior to Admission medications   Not on File   Triage Vitals: BP 143/97 mmHg  Pulse 86  Temp(Src) 98.2 F (36.8 C) (Oral)  Resp 20  Ht 5\' 6"  (1.676 m)  Wt 280 lb (127.007 kg)  BMI 45.21 kg/m2  SpO2 100%  LMP 04/11/2015 Physical Exam  Constitutional: She is oriented to person, place, and time. She appears well-developed and well-nourished. No distress.  HENT:  Head: Normocephalic and atraumatic.  Mouth/Throat: Oropharynx is clear and moist.  Eyes: Conjunctivae and EOM are normal. Pupils are equal, round, and reactive to light.  Neck: Neck supple.  Cardiovascular: Normal rate.   Pulmonary/Chest: Effort normal. No respiratory distress. She has no wheezes. She has no rales.  Abdominal: Soft. She exhibits no distension and no mass. There is no tenderness. There is no rebound and no guarding.  Hyperactive bowel sounds, very gassy throughout  Musculoskeletal: Normal range of motion.  Neurological: She is alert and oriented to person, place, and time.  Skin: Skin is warm and dry.  Psychiatric:  She has a normal mood and affect. Her behavior is normal.  Nursing note and vitals reviewed.   ED Course  Procedures (including critical care time) DIAGNOSTIC STUDIES: Oxygen Saturation is 100% on RA, nl by my interpretation.    COORDINATION OF CARE: 12:11 AM-Discussed treatment plan with pt at bedside and pt agreed to plan.   Labs Review Labs Reviewed  URINALYSIS, ROUTINE W REFLEX  MICROSCOPIC  PREGNANCY, URINE    Imaging Review No results found.   EKG Interpretation None      MDM  Reviewed PCP Note from 04/15/15 for wellness exam, whereby pt made no mention of current Sx.  Final diagnoses:  None   Well appearing and normal vitals.  No indication for labs or advanced imaging at this time  Bethann GooGassy feels better post medication.  Have recommended a bland diet not high in fried foods, gas x and close follow up with her PMD  I personally performed the services described in this documentation, which was scribed in my presence. The recorded information has been reviewed and is accurate.     Cy BlamerApril Havanah Nelms, MD 04/20/15 [phone]

## 2015-04-19 NOTE — ED Notes (Signed)
Generalized abdominal pain x 3 weeks.  Left lower back pain x 3 weeks.  Denies injury.

## 2015-04-20 ENCOUNTER — Encounter (HOSPITAL_BASED_OUTPATIENT_CLINIC_OR_DEPARTMENT_OTHER): Payer: Self-pay | Admitting: Emergency Medicine

## 2015-04-20 ENCOUNTER — Emergency Department (HOSPITAL_BASED_OUTPATIENT_CLINIC_OR_DEPARTMENT_OTHER): Payer: Medicaid Other

## 2015-04-20 MED ORDER — HYOSCYAMINE SULFATE 0.125 MG SL SUBL: 0.1250 mg | SUBLINGUAL_TABLET | Freq: Four times a day (QID) | SUBLINGUAL | Status: DC | PRN

## 2015-04-20 MED ORDER — DICYCLOMINE HCL 10 MG/ML IM SOLN
20.0000 mg | Freq: Once | INTRAMUSCULAR | Status: AC
  Administered 2015-04-20: 20 mg via INTRAMUSCULAR
  Filled 2015-04-20: qty 2

## 2015-04-20 MED ORDER — GI COCKTAIL ~~LOC~~
30.0000 mL | Freq: Once | ORAL | Status: AC
  Administered 2015-04-20: 30 mL via ORAL
  Filled 2015-04-20: qty 30

## 2015-05-12 ENCOUNTER — Emergency Department (HOSPITAL_BASED_OUTPATIENT_CLINIC_OR_DEPARTMENT_OTHER)
Admission: EM | Admit: 2015-05-12 | Discharge: 2015-05-12 | Disposition: A | Discharge status: Home / Self Care | Payer: Medicaid Other | Attending: Emergency Medicine | Admitting: Emergency Medicine

## 2015-05-12 ENCOUNTER — Encounter (HOSPITAL_BASED_OUTPATIENT_CLINIC_OR_DEPARTMENT_OTHER): Payer: Self-pay | Admitting: Emergency Medicine

## 2015-05-12 DIAGNOSIS — R0789 Other chest pain: Secondary | ICD-10-CM | POA: Diagnosis not present

## 2015-05-12 DIAGNOSIS — Z8719 Personal history of other diseases of the digestive system: Secondary | ICD-10-CM | POA: Insufficient documentation

## 2015-05-12 DIAGNOSIS — R079 Chest pain, unspecified: Secondary | ICD-10-CM | POA: Diagnosis present

## 2015-05-12 MED ORDER — PANTOPRAZOLE SODIUM 40 MG PO TBEC: 40.0000 mg | DELAYED_RELEASE_TABLET | Freq: Every day | ORAL | Status: DC

## 2015-05-12 NOTE — ED Notes (Signed)
MD at bedside. 

## 2015-05-12 NOTE — Discharge Instructions (Signed)
Discuss with your primary care doctor about your murmur that was heard on exam. You may benefit from an echocardiogram since you are sometimes having shortness of breath while exerting yourself.    Gastroesophageal Reflux Disease, Adult Gastroesophageal reflux disease (GERD) happens when acid from your stomach flows up into the esophagus. When acid comes in contact with the esophagus, the acid causes soreness (inflammation) in the esophagus. Over time, GERD may create small holes (ulcers) in the lining of the esophagus. CAUSES   Increased body weight. This puts pressure on the stomach, making acid rise from the stomach into the esophagus.  Smoking. This increases acid production in the stomach.  Drinking alcohol. This causes decreased pressure in the lower esophageal sphincter (valve or ring of muscle between the esophagus and stomach), allowing acid from the stomach into the esophagus.  Late evening meals and a full stomach. This increases pressure and acid production in the stomach.  A malformed lower esophageal sphincter. Sometimes, no cause is found. SYMPTOMS   Burning pain in the lower part of the mid-chest behind the breastbone and in the mid-stomach area. This may occur twice a week or more often.  Trouble swallowing.  Sore throat.  Dry cough.  Asthma-like symptoms including chest tightness, shortness of breath, or wheezing. DIAGNOSIS  Your caregiver may be able to diagnose GERD based on your symptoms. In some cases, X-rays and other tests may be done to check for complications or to check the condition of your stomach and esophagus. TREATMENT  Your caregiver may recommend over-the-counter or prescription medicines to help decrease acid production. Ask your caregiver before starting or adding any new medicines.  HOME CARE INSTRUCTIONS   Change the factors that you can control. Ask your caregiver for guidance concerning weight loss, quitting smoking, and alcohol  consumption.  Avoid foods and drinks that make your symptoms worse, such as:  Caffeine or alcoholic drinks.  Chocolate.  Peppermint or mint flavorings.  Garlic and onions.  Spicy foods.  Citrus fruits, such as oranges, lemons, or limes.  Tomato-based foods such as sauce, chili, salsa, and pizza.  Fried and fatty foods.  Avoid lying down for the 3 hours prior to your bedtime or prior to taking a nap.  Eat small, frequent meals instead of large meals.  Wear loose-fitting clothing. Do not wear anything tight around your waist that causes pressure on your stomach.  Raise the head of your bed 6 to 8 inches with wood blocks to help you sleep. Extra pillows will not help.  Only take over-the-counter or prescription medicines for pain, discomfort, or fever as directed by your caregiver.  Do not take aspirin, ibuprofen, or other nonsteroidal anti-inflammatory drugs (NSAIDs). SEEK IMMEDIATE MEDICAL CARE IF:   You have pain in your arms, neck, jaw, teeth, or back.  Your pain increases or changes in intensity or duration.  You develop nausea, vomiting, or sweating (diaphoresis).  You develop shortness of breath, or you faint.  Your vomit is green, yellow, black, or looks like coffee grounds or blood.  Your stool is red, bloody, or black. These symptoms could be signs of other problems, such as heart disease, gastric bleeding, or esophageal bleeding. MAKE SURE YOU:   Understand these instructions.  Will watch your condition.  Will get help right away if you are not doing well or get worse. Document Released: 09/06/2005 Document Revised: 02/19/2012 Document Reviewed: 06/16/2011 [facility]ExitCare Patient Information 2015 [place], [place]. This information is not intended to replace advice given to you  by your health care provider. Make sure you discuss any questions you have with your health care provider. ° °

## 2015-05-12 NOTE — ED Notes (Signed)
Patient states that she is here because her throat hurts and her mouth and nose were bleeding. The patient also reports that she has had chest pain throughout her whole chest x months

## 2015-05-12 NOTE — ED Provider Notes (Signed)
CSN: [id number]     Arrival date & time 05/12/15  1953 History   First Selena Boyd Initiated Contact with Patient 05/12/15 2019     Chief Complaint  Patient presents with  . Chest Pain   HPI Selena Boyd is a 28yo woman with PMHx of GERD who presents today with chest pain. Patient reports the pain has been "on and off" for the last 6 months. She states she had an episode of chest pain today that was sharp, located on both sides of her chest, and non-radiating. She denies any associated nausea, vomiting, or diaphoresis. She notes dyspnea with walking and that she occasionally becomes short of breath while lying down. She reports leg swelling occasionally. She denies symptoms of PND. She reports she also had a nosebleed today. She has had nosebleeds in the past. She reports she did not have significant bleeding with her C-section. She also notes symptoms of sore throat, congestion, and cough. She attributes these symptoms to her allergies. Denies fevers, chills, abdominal pain, and wheezing.    Past Medical History  Diagnosis Date  . GERD (gastroesophageal reflux disease)    Past Surgical History  Procedure Laterality Date  . Cesarean section    . Laparoscopy     History reviewed. No pertinent family history. History  Substance Use Topics  . Smoking status: Never Smoker   . Smokeless tobacco: Never Used  . Alcohol Use: No   OB History    No data available     Review of Systems General: Denies night sweats, changes in weight, changes in appetite HEENT: Denies headaches, ear pain, changes in vision CV: Denies palpitations Pulm: See above  GI: Denies diarrhea, constipation, melena, hematochezia GU: Denies dysuria, hematuria, frequency Msk: Denies muscle cramps, joint pains Neuro: Denies weakness, numbness, tingling Skin: Denies rashes, bruising   Allergies  Review of patient's allergies indicates no known allergies.  Home Medications   Prior to Admission medications   Medication Sig  Start Date End Date Taking? Authorizing Provider  hyoscyamine (LEVSIN/SL) 0.125 MG SL tablet Place 1 tablet (0.125 mg total) under the tongue every 6 (six) hours as needed for cramping. 04/20/15   April Palumbo, Selena Boyd   BP 139/77 mmHg  Pulse 89  Temp(Src) 99 F (37.2 C) (Oral)  Resp 16  Ht  (1.676 m)  Wt 279 lb (126.554 kg)  BMI 45.05 kg/m2  SpO2 100%  LMP 04/11/2015 Physical Exam General: young woman sitting up in bed, NAD  HEENT: [place]/AT, EOMI, sclera anicteric, pharynx mildly erythematous, mucus membranes moist CV: RRR, 2/6 systolic murmur heard best at LUSB Pulm: CTA bilaterally, breaths non-labored Abd: BS+, soft, obese, non-tender Ext: warm, no edema Neuro: alert and oriented x 3, no focal deficits  ED Course  Procedures (including critical care time) Labs Review Labs Reviewed - No data to display   EKG Interpretation   Date/Time:  Wednesday May 12 2015 20:14:27 EDT Ventricular Rate:  94 PR Interval:  172 QRS Duration: 80 QT Interval:  334 QTC Calculation: 417 R Axis:   27 Text Interpretation:  Normal sinus rhythm Nonspecific T wave abnormality  Abnormal ECG No significant change since last tracing Confirmed by BEATON   Selena Boyd, ROBERT ([id number]) on 05/12/2015 8:56:35 PM      MDM   Final diagnoses:  Atypical chest pain   Patient presenting with 6 month hx of intermittent sharp chest pain that is occurring on both sides of chest in setting of untreated GERD. Her EKG has nonspecific T  wave abnormalities, but no concerning ischemic changes. Do not suspect ACS at this time. Will start on Protonix to treat her GERD.  I did appreciate a systolic murmur on exam that has not been noted previously. Recommended for her to follow up with her PCP in regards to this as she is having dyspnea occasionally with exertion. Although suspect that her dyspnea is related to deconditioning and her obesity. She may benefit from an echocardiogram as an outpatient.   Discharged with Protonix 40  mg daily and recommended to follow up with PCP in next week.  Selena Numberarly Taria Castrillo, Selena Boyd IMTS PGY-1 Pager: [phone]      Selena Hoffarly J Qualyn Oyervides, Selena Boyd 05/12/15 [phone]  Selena Nayobert Beaton, Selena Boyd 05/17/15 [phone]

## 2015-06-07 ENCOUNTER — Emergency Department (HOSPITAL_BASED_OUTPATIENT_CLINIC_OR_DEPARTMENT_OTHER)
Admission: EM | Admit: 2015-06-07 | Discharge: 2015-06-07 | Disposition: A | Discharge status: Home / Self Care | Payer: Medicaid Other | Attending: Emergency Medicine | Admitting: Emergency Medicine

## 2015-06-07 ENCOUNTER — Encounter (HOSPITAL_BASED_OUTPATIENT_CLINIC_OR_DEPARTMENT_OTHER): Payer: Self-pay | Admitting: Emergency Medicine

## 2015-06-07 DIAGNOSIS — K219 Gastro-esophageal reflux disease without esophagitis: Secondary | ICD-10-CM | POA: Insufficient documentation

## 2015-06-07 DIAGNOSIS — R51 Headache: Secondary | ICD-10-CM | POA: Insufficient documentation

## 2015-06-07 DIAGNOSIS — R1013 Epigastric pain: Secondary | ICD-10-CM | POA: Insufficient documentation

## 2015-06-07 DIAGNOSIS — R312 Other microscopic hematuria: Secondary | ICD-10-CM | POA: Diagnosis not present

## 2015-06-07 DIAGNOSIS — Z79899 Other long term (current) drug therapy: Secondary | ICD-10-CM | POA: Insufficient documentation

## 2015-06-07 DIAGNOSIS — Z3202 Encounter for pregnancy test, result negative: Secondary | ICD-10-CM | POA: Diagnosis not present

## 2015-06-07 DIAGNOSIS — R42 Dizziness and giddiness: Secondary | ICD-10-CM | POA: Diagnosis not present

## 2015-06-07 DIAGNOSIS — R3129 Other microscopic hematuria: Secondary | ICD-10-CM

## 2015-06-07 DIAGNOSIS — R300 Dysuria: Secondary | ICD-10-CM | POA: Diagnosis present

## 2015-06-07 LAB — COMPREHENSIVE METABOLIC PANEL
ALBUMIN: 3.6 g/dL (ref 3.5–5.0)
ALK PHOS: 59 U/L (ref 38–126)
ALT: 13 U/L — AB (ref 14–54)
ANION GAP: 6 (ref 5–15)
AST: 15 U/L (ref 15–41)
BUN: 11 mg/dL (ref 6–20)
CO2: 26 mmol/L (ref 22–32)
Calcium: 9.2 mg/dL (ref 8.9–10.3)
Chloride: 103 mmol/L (ref 101–111)
Creatinine, Ser: 0.85 mg/dL (ref 0.44–1.00)
GFR calc Af Amer: >60 mL/min (ref >60)
GFR calc non Af Amer: >60 mL/min (ref >60)
Glucose, Bld: 110 mg/dL — ABNORMAL HIGH (ref 65–99)
POTASSIUM: 4.1 mmol/L (ref 3.5–5.1)
SODIUM: 135 mmol/L (ref 135–145)
TOTAL PROTEIN: 7.9 g/dL (ref 6.5–8.1)
Total Bilirubin: 0.4 mg/dL (ref 0.3–1.2)

## 2015-06-07 LAB — CBC WITH DIFFERENTIAL/PLATELET
BASOS PCT: 1 % (ref 0–1)
Basophils Absolute: 0.1 10*3/uL (ref 0.0–0.1)
Eosinophils Absolute: 0.1 10*3/uL (ref 0.0–0.7)
Eosinophils Relative: 2 % (ref 0–5)
HCT: 38.3 % (ref 36.0–46.0)
Hemoglobin: 12.2 g/dL (ref 12.0–15.0)
Lymphocytes Relative: 33 % (ref 12–46)
Lymphs Abs: 2.1 10*3/uL (ref 0.7–4.0)
MCH: 26.3 pg (ref 26.0–34.0)
MCHC: 31.9 g/dL (ref 30.0–36.0)
MCV: 82.7 fL (ref 78.0–100.0)
Monocytes Absolute: 0.6 10*3/uL (ref 0.1–1.0)
Monocytes Relative: 9 % (ref 3–12)
NEUTROS PCT: 55 % (ref 43–77)
Neutro Abs: 3.4 10*3/uL (ref 1.7–7.7)
PLATELETS: 568 10*3/uL — AB (ref 150–400)
RBC: 4.63 MIL/uL (ref 3.87–5.11)
RDW: 15.2 % (ref 11.5–15.5)
WBC: 6.2 10*3/uL (ref 4.0–10.5)

## 2015-06-07 LAB — URINE MICROSCOPIC-ADD ON

## 2015-06-07 LAB — URINALYSIS, ROUTINE W REFLEX MICROSCOPIC
BILIRUBIN URINE: NEGATIVE
GLUCOSE, UA: NEGATIVE mg/dL
Ketones, ur: NEGATIVE mg/dL
Leukocytes, UA: NEGATIVE
Nitrite: NEGATIVE
PROTEIN: NEGATIVE mg/dL
Specific Gravity, Urine: 1.02 (ref 1.005–1.030)
Urobilinogen, UA: 0.2 mg/dL (ref 0.0–1.0)
pH: 6 (ref 5.0–8.0)

## 2015-06-07 LAB — PREGNANCY, URINE: Preg Test, Ur: NEGATIVE

## 2015-06-07 LAB — LIPASE, BLOOD: LIPASE: 28 U/L (ref 22–51)

## 2015-06-07 MED ORDER — ACETAMINOPHEN 500 MG PO TABS
1000.0000 mg | ORAL_TABLET | Freq: Once | ORAL | Status: AC
Start: 2015-06-07 — End: 2015-06-07
  Administered 2015-06-07: 1000 mg via ORAL
  Filled 2015-06-07: qty 2

## 2015-06-07 NOTE — ED Provider Notes (Signed)
CSN: [id number]     Arrival date & time 06/07/15  1006 History   First MD Initiated Contact with Patient 06/07/15 1021     No chief complaint on file.  chief complaint headache, abdominal pain   (Consider location/radiation/quality/duration/timing/severity/associated sxs/prior Treatment) HPI Patient complains of mild headache behind both eyes, gradual onset and abdominal epigastric pain, nonradiating onset yesterday. Accompanied by lightheadedness. She noticed a pink tinge to her urine this morning. She denies vaginal discharge. Last normal menstrual period one month ago. Last bowel movement yesterday "hard " no blood per rectum. No other associated symptoms. She treated herself with Tylenol yesterday, with relief. Denies dysuria and no vaginal discharge Past Medical History  Diagnosis Date  . GERD (gastroesophageal reflux disease)    Past Surgical History  Procedure Laterality Date  . Cesarean section    . Laparoscopy     No family history on file. History  Substance Use Topics  . Smoking status: Never Smoker   . Smokeless tobacco: Never Used  . Alcohol Use: No   OB History    No data available     Review of Systems  Constitutional: Negative.   Respiratory: Negative.   Cardiovascular: Negative.   Gastrointestinal: Positive for abdominal pain.  Musculoskeletal: Negative.   Skin: Negative.   Neurological: Positive for light-headedness and headaches.  Psychiatric/Behavioral: Negative.   All other systems reviewed and are negative.     Allergies  Review of patient's allergies indicates no known allergies.  Home Medications   Prior to Admission medications   Medication Sig Start Date End Date Taking? Authorizing Provider  hyoscyamine (LEVSIN/SL) 0.125 MG SL tablet Place 1 tablet (0.125 mg total) under the tongue every 6 (six) hours as needed for cramping. 04/20/15   April Palumbo, MD  pantoprazole (PROTONIX) 40 MG tablet Take 1 tablet (40 mg total) by mouth daily.  05/12/15   Carly J Rivet, MD   BP 136/60 mmHg  Pulse 83  Temp(Src) 97.6 F (36.4 C) (Oral)  Resp 17  Ht  (1.676 m)  Wt 279 lb (126.554 kg)  BMI 45.05 kg/m2  SpO2 99% Physical Exam  Constitutional: She is oriented to person, place, and time. She appears well-developed and well-nourished. No distress.  HENT:  Head: Normocephalic and atraumatic.  Right Ear: External ear normal.  Left Ear: External ear normal.  Mouth/Throat: Oropharynx is clear and moist.  Eyes: Conjunctivae are normal. Pupils are equal, round, and reactive to light.  Neck: Neck supple. No tracheal deviation present. No thyromegaly present.  Cardiovascular: Normal rate and regular rhythm.   No murmur heard. Pulmonary/Chest: Effort normal and breath sounds normal.  Abdominal: Soft. Bowel sounds are normal. She exhibits no distension. There is no tenderness.  Morbidly obese  Musculoskeletal: Normal range of motion. She exhibits no edema or tenderness.  Neurological: She is alert and oriented to person, place, and time. No cranial nerve deficit. Coordination normal.   gait normal Romberg normal pronator drift normal finger to nose normal  Skin: Skin is warm and dry. No rash noted.  Psychiatric: She has a normal mood and affect.  Nursing note and vitals reviewed.   ED Course  Procedures (including critical care time) Labs Review Labs Reviewed - No data to display  Imaging Review No results found.   EKG Interpretation None     11:55 AM pain improved. Patient alert feels ready to go home Results for orders placed or performed during the hospital encounter of 06/07/15  Comprehensive metabolic panel  Result Value Ref Range   Sodium 135 135 - 145 mmol/L   Potassium 4.1 3.5 - 5.1 mmol/L   Chloride 103 101 - 111 mmol/L   CO2 26 22 - 32 mmol/L   Glucose, Bld 110 (H) 65 - 99 mg/dL   BUN 11 6 - 20 mg/dL   Creatinine, Ser 7.85 0.44 - 1.00 mg/dL   Calcium 9.2 8.9 - 88.5 mg/dL   Total Protein 7.9 6.5 - 8.1 g/dL    Albumin 3.6 3.5 - 5.0 g/dL   AST 15 15 - 41 U/L   ALT 13 (L) 14 - 54 U/L   Alkaline Phosphatase 59 38 - 126 U/L   Total Bilirubin 0.4 0.3 - 1.2 mg/dL   GFR calc non Af Amer >60 >60 mL/min   GFR calc Af Amer >60 >60 mL/min   Anion gap 6 5 - 15  CBC with Differential/Platelet  Result Value Ref Range   WBC 6.2 4.0 - 10.5 K/uL   RBC 4.63 3.87 - 5.11 MIL/uL   Hemoglobin 12.2 12.0 - 15.0 g/dL   HCT 02.7 74.1 - 28.7 %   MCV 82.7 78.0 - 100.0 fL   MCH 26.3 26.0 - 34.0 pg   MCHC 31.9 30.0 - 36.0 g/dL   RDW 86.7 67.2 - 09.4 %   Platelets 568 (H) 150 - 400 K/uL   Neutrophils Relative % 55 43 - 77 %   Neutro Abs 3.4 1.7 - 7.7 K/uL   Lymphocytes Relative 33 12 - 46 %   Lymphs Abs 2.1 0.7 - 4.0 K/uL   Monocytes Relative 9 3 - 12 %   Monocytes Absolute 0.6 0.1 - 1.0 K/uL   Eosinophils Relative 2 0 - 5 %   Eosinophils Absolute 0.1 0.0 - 0.7 K/uL   Basophils Relative 1 0 - 1 %   Basophils Absolute 0.1 0.0 - 0.1 K/uL  Lipase, blood  Result Value Ref Range   Lipase 28 22 - 51 U/L  Urinalysis, Routine w reflex microscopic (not at [facility])  Result Value Ref Range   Color, Urine YELLOW YELLOW   APPearance CLOUDY (A) CLEAR   Specific Gravity, Urine 1.020 1.005 - 1.030   pH 6.0 5.0 - 8.0   Glucose, UA NEGATIVE NEGATIVE mg/dL   Hgb urine dipstick LARGE (A) NEGATIVE   Bilirubin Urine NEGATIVE NEGATIVE   Ketones, ur NEGATIVE NEGATIVE mg/dL   Protein, ur NEGATIVE NEGATIVE mg/dL   Urobilinogen, UA 0.2 0.0 - 1.0 mg/dL   Nitrite NEGATIVE NEGATIVE   Leukocytes, UA NEGATIVE NEGATIVE  Pregnancy, urine  Result Value Ref Range   Preg Test, Ur NEGATIVE NEGATIVE  Urine microscopic-add on  Result Value Ref Range   Squamous Epithelial / LPF FEW (A) RARE   WBC, UA 0-2 <3 WBC/hpf   RBC / HPF 7-10 <3 RBC/hpf   Bacteria, UA FEW (A) RARE   Urine-Other MUCOUS PRESENT    No results found.  MDM  Plan follow-up with Dr. Reola Calkins, PMD. She has an appointment for 06/23/2015 Hematuria is chronic last one year.  Thrombocytosis is chronic. Patient has known fibroid disease Suggest referral to urologist Donnell pain is felt to be nonspecific. Headache felt to be nonspecific Final diagnoses:  None  Dx #1 headache #2 nonspecific abdominal pain #3 thrombocytosis #4 microscopic hematuria      Doug Sou, MD 06/07/15 1203

## 2015-06-07 NOTE — Discharge Instructions (Signed)
Hematuria Take Tylenol as directed for pain. Keep your scheduled plan with Dr. Reola CalkinsBeck for 06/23/2015. Ask him for referral to a urologist. You have had trace microscopic amounts of blood in your urine for the past one year. You may need to have further testing performed by a specialist to find out the cause. Hematuria is blood in your urine. It can be caused by a bladder infection, kidney infection, prostate infection, kidney stone, or cancer of your urinary tract. Infections can usually be treated with medicine, and a kidney stone usually will pass through your urine. If neither of these is the cause of your hematuria, further workup to find out the reason may be needed. It is very important that you tell your health care provider about any blood you see in your urine, even if the blood stops without treatment or happens without causing pain. Blood in your urine that happens and then stops and then happens again can be a symptom of a very serious condition. Also, pain is not a symptom in the initial stages of many urinary cancers. HOME CARE INSTRUCTIONS   Drink lots of fluid, 3-4 quarts a day. If you have been diagnosed with an infection, cranberry juice is especially recommended, in addition to large amounts of water.  Avoid caffeine, tea, and carbonated beverages because they tend to irritate the bladder.  Avoid alcohol because it may irritate the prostate.  Take all medicines as directed by your health care provider.  If you were prescribed an antibiotic medicine, finish it all even if you start to feel better.  If you have been diagnosed with a kidney stone, follow your health care provider's instructions regarding straining your urine to catch the stone.  Empty your bladder often. Avoid holding urine for long periods of time.  After a bowel movement, women should cleanse front to back. Use each tissue only once.  Empty your bladder before and after sexual intercourse if you are a  female. SEEK MEDICAL CARE IF:  You develop back pain.  You have a fever.  You have a feeling of sickness in your stomach (nausea) or vomiting.  Your symptoms are not better in 3 days. Return sooner if you are getting worse. SEEK IMMEDIATE MEDICAL CARE IF:   You develop severe vomiting and are unable to keep the medicine down.  You develop severe back or abdominal pain despite taking your medicines.  You begin passing a large amount of blood or clots in your urine.  You feel extremely weak or faint, or you pass out. MAKE SURE YOU:   Understand these instructions.  Will watch your condition.  Will get help right away if you are not doing well or get worse. Document Released: 11/27/2005 Document Revised: 04/13/2014 Document Reviewed: 07/28/2013 [facility] Patient Information 2015 [place], [place]. This information is not intended to replace advice given to you by your health care provider. Make sure you discuss any questions you have with your health care provider.

## 2015-06-07 NOTE — ED Notes (Signed)
Patient ask to change into gown.

## 2015-06-07 NOTE — ED Notes (Signed)
Hematuria x 3 days.  Some dizziness and headache last night.  Some urgency.  Some frequency.

## 2015-06-07 NOTE — ED Notes (Signed)
Patient ambulated from waiting to Room 11 without difficulty

## 2015-09-04 ENCOUNTER — Emergency Department (HOSPITAL_BASED_OUTPATIENT_CLINIC_OR_DEPARTMENT_OTHER): Payer: Medicaid Other

## 2015-09-04 ENCOUNTER — Emergency Department (HOSPITAL_BASED_OUTPATIENT_CLINIC_OR_DEPARTMENT_OTHER)
Admission: EM | Admit: 2015-09-04 | Discharge: 2015-09-04 | Disposition: A | Discharge status: Home / Self Care | Payer: Medicaid Other | Attending: Physician Assistant | Admitting: Physician Assistant

## 2015-09-04 ENCOUNTER — Encounter (HOSPITAL_BASED_OUTPATIENT_CLINIC_OR_DEPARTMENT_OTHER): Payer: Self-pay | Admitting: Emergency Medicine

## 2015-09-04 DIAGNOSIS — D649 Anemia, unspecified: Secondary | ICD-10-CM | POA: Diagnosis not present

## 2015-09-04 DIAGNOSIS — K219 Gastro-esophageal reflux disease without esophagitis: Secondary | ICD-10-CM | POA: Insufficient documentation

## 2015-09-04 DIAGNOSIS — Z79899 Other long term (current) drug therapy: Secondary | ICD-10-CM | POA: Insufficient documentation

## 2015-09-04 DIAGNOSIS — R51 Headache: Secondary | ICD-10-CM | POA: Diagnosis present

## 2015-09-04 DIAGNOSIS — R079 Chest pain, unspecified: Secondary | ICD-10-CM | POA: Insufficient documentation

## 2015-09-04 DIAGNOSIS — R42 Dizziness and giddiness: Secondary | ICD-10-CM | POA: Diagnosis not present

## 2015-09-04 LAB — CBC WITH DIFFERENTIAL/PLATELET
Basophils Absolute: 0.1 10*3/uL (ref 0.0–0.1)
Basophils Relative: 1 %
EOS ABS: 0.2 10*3/uL (ref 0.0–0.7)
EOS PCT: 2 %
HCT: 37.5 % (ref 36.0–46.0)
Hemoglobin: 11.9 g/dL — ABNORMAL LOW (ref 12.0–15.0)
LYMPHS ABS: 2.8 10*3/uL (ref 0.7–4.0)
Lymphocytes Relative: 37 %
MCH: 27.3 pg (ref 26.0–34.0)
MCHC: 31.7 g/dL (ref 30.0–36.0)
MCV: 86 fL (ref 78.0–100.0)
MONOS PCT: 7 %
Monocytes Absolute: 0.5 10*3/uL (ref 0.1–1.0)
Neutro Abs: 4 10*3/uL (ref 1.7–7.7)
Neutrophils Relative %: 53 %
PLATELETS: 492 10*3/uL — AB (ref 150–400)
RBC: 4.36 MIL/uL (ref 3.87–5.11)
RDW: 14.9 % (ref 11.5–15.5)
WBC: 7.5 10*3/uL (ref 4.0–10.5)

## 2015-09-04 LAB — BASIC METABOLIC PANEL
Anion gap: 6 (ref 5–15)
BUN: 12 mg/dL (ref 6–20)
CO2: 25 mmol/L (ref 22–32)
CREATININE: 1.04 mg/dL — AB (ref 0.44–1.00)
Calcium: 8.8 mg/dL — ABNORMAL LOW (ref 8.9–10.3)
Chloride: 107 mmol/L (ref 101–111)
GFR calc Af Amer: >60 mL/min (ref >60)
GFR calc non Af Amer: >60 mL/min (ref >60)
Glucose, Bld: 92 mg/dL (ref 65–99)
Potassium: 4 mmol/L (ref 3.5–5.1)
Sodium: 138 mmol/L (ref 135–145)

## 2015-09-04 MED ORDER — IBUPROFEN 800 MG PO TABS
800.0000 mg | ORAL_TABLET | Freq: Once | ORAL | Status: AC
  Administered 2015-09-04: 800 mg via ORAL
  Filled 2015-09-04: qty 1

## 2015-09-04 NOTE — ED Notes (Signed)
Patient states that she has had a Headache x 4 days. Patient reports a "nauhing" pain to her central chest today

## 2015-09-04 NOTE — Discharge Instructions (Signed)
Your xray and labs were all fairly normal. YOu do have evidence of continued anemai. Please follow up with your regular phsyician for this and follow their orders.   Anemia, Nonspecific Anemia is a condition in which the concentration of red blood cells or hemoglobin in the blood is below normal. Hemoglobin is a substance in red blood cells that carries oxygen to the tissues of the body. Anemia results in not enough oxygen reaching these tissues.  CAUSES  Common causes of anemia include:   Excessive bleeding. Bleeding may be internal or external. This includes excessive bleeding from periods (in women) or from the intestine.   Poor nutrition.   Chronic kidney, thyroid, and liver disease.  Bone marrow disorders that decrease red blood cell production.  Cancer and treatments for cancer.  HIV, AIDS, and their treatments.  Spleen problems that increase red blood cell destruction.  Blood disorders.  Excess destruction of red blood cells due to infection, medicines, and autoimmune disorders. SIGNS AND SYMPTOMS   Minor weakness.   Dizziness.   Headache.  Palpitations.   Shortness of breath, especially with exercise.   Paleness.  Cold sensitivity.  Indigestion.  Nausea.  Difficulty sleeping.  Difficulty concentrating. Symptoms may occur suddenly or they may develop slowly.  DIAGNOSIS  Additional blood tests are often needed. These help your health care provider determine the best treatment. Your health care provider will check your stool for blood and look for other causes of blood loss.  TREATMENT  Treatment varies depending on the cause of the anemia. Treatment can include:   Supplements of iron, vitamin B12, or folic acid.   Hormone medicines.   A blood transfusion. This may be needed if blood loss is severe.   Hospitalization. This may be needed if there is significant continual blood loss.   Dietary changes.  Spleen removal. HOME CARE  INSTRUCTIONS Keep all follow-up appointments. It often takes many weeks to correct anemia, and having your health care provider check on your condition and your response to treatment is very important. SEEK IMMEDIATE MEDICAL CARE IF:   You develop extreme weakness, shortness of breath, or chest pain.   You become dizzy or have trouble concentrating.  You develop heavy vaginal bleeding.   You develop a rash.   You have bloody or black, tarry stools.   You faint.   You vomit up blood.   You vomit repeatedly.   You have abdominal pain.  You have a fever or persistent symptoms for more than 2-3 days.   You have a fever and your symptoms suddenly get worse.   You are dehydrated.  MAKE SURE YOU:  Understand these instructions.  Will watch your condition.  Will get help right away if you are not doing well or get worse. Document Released: 01/04/2005 Document Revised: 07/30/2013 Document Reviewed: 05/23/2013 [facility] Patient Information 2015 [place], [place]. This information is not intended to replace advice given to you by your health care provider. Make sure you discuss any questions you have with your health care provider.

## 2015-09-04 NOTE — ED Provider Notes (Signed)
CSN: [id number]     Arrival date & time 09/04/15  1857 History  This chart was scribed for Selena Randall An, MD by Lyndel Safe, ED Scribe. This patient was seen in room MH12/MH12 and the patient's care was started 7:21 PM.  Chief Complaint  Patient presents with  . Headache   The history is provided by the patient. No language interpreter was used.   HPI Comments: Selena Boyd is a 29 y.o. female who presents to the Emergency Department complaining of a mild generalized headache onset 7 days ago. She reports associated light-headedness and states she hurts everywhere. Pt additionally complains of intermittent, sharp generalized chest pain that has been present for a couple of months. Pt reports she took her BP at [employer] 1 hour ago and found it to be 151/102 mmHg. Current BP at 137/143mmHg. She has not been diagnosed with HTN in the past. Pt has not taken any ibuprofen today but she has experienced mild relief of pain with taking ibuprofen in the past. She is in the process of finding a new PCP.    Past Medical History  Diagnosis Date  . GERD (gastroesophageal reflux disease)    Past Surgical History  Procedure Laterality Date  . Cesarean section    . Laparoscopy     History reviewed. No pertinent family history. Social History  Substance Use Topics  . Smoking status: Never Smoker   . Smokeless tobacco: Never Used  . Alcohol Use: No   OB History    No data available     Review of Systems  Cardiovascular: Positive for chest pain.  Musculoskeletal: Positive for myalgias ( diffuse).  Neurological: Positive for light-headedness and headaches.  All other systems reviewed and are negative.  Allergies  Review of patient's allergies indicates no known allergies.  Home Medications   Prior to Admission medications   Medication Sig Start Date End Date Taking? Authorizing Provider  hyoscyamine (LEVSIN/SL) 0.125 MG SL tablet Place 1 tablet (0.125 mg total) under the tongue  every 6 (six) hours as needed for cramping. 04/20/15   April Palumbo, MD  pantoprazole (PROTONIX) 40 MG tablet Take 1 tablet (40 mg total) by mouth daily. 05/12/15   Carly J Rivet, MD   BP 137/104 mmHg  Pulse 92  Temp(Src) 98.1 F (36.7 C) (Oral)  Resp 18  Ht  (1.676 m)  Wt 270 lb (122.471 kg)  BMI 43.60 kg/m2  SpO2 100%  LMP 09/04/2015 Physical Exam  Constitutional: She is oriented to person, place, and time. She appears well-developed and well-nourished. No distress.  NAD.  HENT:  Head: Normocephalic.  Mouth/Throat: Oropharynx is clear and moist. No oropharyngeal exudate.  Tongue midline; oropharynx clear, no erythema.   Eyes: Conjunctivae are normal.  Neck: Neck supple.  Cardiovascular: Normal rate, regular rhythm, normal heart sounds and intact distal pulses.   Distal pulses intact.   Pulmonary/Chest: Effort normal and breath sounds normal. No respiratory distress.  Lungs clear to auscultation bilaterally.   Abdominal: Soft. There is no tenderness.  Lymphadenopathy:    She has no cervical adenopathy.  Neurological: She is alert and oriented to person, place, and time. No cranial nerve deficit.  Cranial nerves 2-12 intact; Alert and oriented X 3; MAE X 4.     ED Course  Procedures  DIAGNOSTIC STUDIES: Oxygen Saturation is 100% on RA, normal by my interpretation.    COORDINATION OF CARE: 7:29 PM Discussed treatment plan with pt at bedside which includes to order diagnostic labs  and chest Xray and pt agreed to plan.   Labs Review Labs Reviewed - No data to display  Imaging Review No results found. I have personally reviewed and evaluated these images and lab results as part of my medical decision-making.   EKG Interpretation   Date/Time:  Saturday September 04 2015 19:08:47 EDT Ventricular Rate:  92 PR Interval:  160 QRS Duration: 70 QT Interval:  350 QTC Calculation: 432 R Axis:   10 Text Interpretation:  Normal sinus rhythm Minimal voltage criteria for   LVH, may be normal variant Cannot rule out Anterior infarct , age  undetermined Abnormal ECG no acute ischemia No significant change since  last tracing Confirmed by Kandis Mannan ([id number]) on 09/04/2015 7:10:56  PM      MDM   Final diagnoses:  None    Patient is a very well-appearing 29 year old female presented with multiple complaints. She states that she's had a headache for a long time, 7 days. No red flags. She's also had chest pain on and off for the last several months.  Patient is concerned that she has been diagnosed anemia and worried that her chest pain and headache might be due to low blood count. We will get a CBC and Chem-7 here. We'll get  x-ray and EKG for chest pain.    anticipate ability to discharge home. Given normal vital signs and physical exam.  I personally performed the services described in this documentation, which was scribed in my presence. The recorded information has been reviewed and is accurate.    Selena Randall An, MD 09/04/15 [phone]

## 2015-09-04 NOTE — ED Notes (Signed)
Patient is more concerned about her headache than her chest pain

## 2015-12-03 ENCOUNTER — Emergency Department (HOSPITAL_BASED_OUTPATIENT_CLINIC_OR_DEPARTMENT_OTHER)
Admission: EM | Admit: 2015-12-03 | Discharge: 2015-12-03 | Discharge status: Against Medical Advice | Payer: Medicaid Other | Attending: Emergency Medicine | Admitting: Emergency Medicine

## 2015-12-03 ENCOUNTER — Encounter (HOSPITAL_BASED_OUTPATIENT_CLINIC_OR_DEPARTMENT_OTHER): Payer: Self-pay | Admitting: *Deleted

## 2015-12-03 DIAGNOSIS — R0989 Other specified symptoms and signs involving the circulatory and respiratory systems: Secondary | ICD-10-CM | POA: Insufficient documentation

## 2015-12-03 NOTE — ED Notes (Signed)
Pt c/o URI symptoms x 3 weeks 

## 2015-12-03 NOTE — ED Notes (Signed)
Called for pt x 2-no answer in ED WR

## 2016-01-17 ENCOUNTER — Emergency Department (HOSPITAL_BASED_OUTPATIENT_CLINIC_OR_DEPARTMENT_OTHER)
Admission: EM | Admit: 2016-01-17 | Discharge: 2016-01-17 | Disposition: A | Discharge status: Home / Self Care | Payer: Medicaid Other | Attending: Emergency Medicine | Admitting: Emergency Medicine

## 2016-01-17 ENCOUNTER — Encounter (HOSPITAL_BASED_OUTPATIENT_CLINIC_OR_DEPARTMENT_OTHER): Payer: Self-pay

## 2016-01-17 DIAGNOSIS — K0889 Other specified disorders of teeth and supporting structures: Secondary | ICD-10-CM | POA: Insufficient documentation

## 2016-01-17 DIAGNOSIS — Z8719 Personal history of other diseases of the digestive system: Secondary | ICD-10-CM | POA: Insufficient documentation

## 2016-01-17 MED ORDER — PENICILLIN V POTASSIUM 500 MG PO TABS: 500.0000 mg | ORAL_TABLET | Freq: Four times a day (QID) | ORAL | Status: AC

## 2016-01-17 MED ORDER — IBUPROFEN 400 MG PO TABS: 400.0000 mg | ORAL_TABLET | Freq: Four times a day (QID) | ORAL | Status: AC | PRN

## 2016-01-17 NOTE — ED Notes (Signed)
Right upper toothache x 3 weeks

## 2016-01-17 NOTE — ED Provider Notes (Signed)
CSN: [id number]     Arrival date & time 01/17/16  1624 History   First MD Initiated Contact with Patient 01/17/16 1749     Chief Complaint  Patient presents with  . Dental Pain     (Consider location/radiation/quality/duration/timing/severity/associated sxs/prior Treatment) HPI Selena Boyd is a 30 y.o. female who comes in for evaluation of dental pain. Patient reports symptoms have been ongoing for the past 3 weeks. She has not seen a dentist for this problem. She localizes her discomfort to her right lower molars. She denies any fevers, chills, sore throat, neck pain, difficulties opening her jaw or voice changes. Has taken Tylenol without relief of her symptoms. No other modifying factors.  Past Medical History  Diagnosis Date  . GERD (gastroesophageal reflux disease)    Past Surgical History  Procedure Laterality Date  . Cesarean section    . Laparoscopy     No family history on file. Social History  Substance Use Topics  . Smoking status: Never Smoker   . Smokeless tobacco: Never Used  . Alcohol Use: No   OB History    No data available     Review of Systems A 10 point review of systems was completed and was negative except for pertinent positives and negatives as mentioned in the history of present illness     Allergies  Review of patient's allergies indicates no known allergies.  Home Medications   Prior to Admission medications   Medication Sig Start Date End Date Taking? Authorizing Provider  ibuprofen (ADVIL,MOTRIN) 400 MG tablet Take 1 tablet (400 mg total) by mouth every 6 (six) hours as needed. 01/17/16   Joycie Peek, PA-C  penicillin v potassium (VEETID) 500 MG tablet Take 1 tablet (500 mg total) by mouth 4 (four) times daily. 01/17/16 01/24/16  Joycie Peek, PA-C   BP 152/100 mmHg  Pulse 94  Temp(Src) 98.5 F (36.9 C) (Oral)  Resp 16  Ht  (1.676 m)  Wt 122.471 kg  BMI 43.60 kg/m2  SpO2 100%  LMP 12/29/2015 Physical Exam   Constitutional:  Awake, alert, nontoxic appearance.  HENT:  Head: Atraumatic.  Discomfort located to right mandibular molars. Overall appropriate dentition. Active caries. Mucous membranes are moist. No unilateral tonsillar swelling, uvula midline, no glossal swelling or elevation. No trismus. No fluctuance or evidence of a drainable abscess. No other evidence of emergent infection, Retropharyngeal or Peritonsillar abscess, Ludwig or Vincents angina. Tolerating secretions well. Patent airway   Eyes: Right eye exhibits no discharge. Left eye exhibits no discharge.  Neck: Neck supple.  Pulmonary/Chest: Effort normal. She exhibits no tenderness.  Abdominal: Soft. There is no tenderness. There is no rebound.  Musculoskeletal: She exhibits no tenderness.  Baseline ROM, no obvious new focal weakness.  Neurological:  Mental status and motor strength appears baseline for patient and situation.  Skin: No rash noted.  Psychiatric: She has a normal mood and affect.  Nursing note and vitals reviewed.   ED Course  Procedures (including critical care time) Labs Review Labs Reviewed - No data to display  Imaging Review No results found. I have personally reviewed and evaluated these images and lab results as part of my medical decision-making.   EKG Interpretation None     Meds given in ED:  Medications - No data to display  New Prescriptions   IBUPROFEN (ADVIL,MOTRIN) 400 MG TABLET    Take 1 tablet (400 mg total) by mouth every 6 (six) hours as needed.   PENICILLIN V POTASSIUM (VEETID)  500 MG TABLET    Take 1 tablet (500 mg total) by mouth 4 (four) times daily.   Filed Vitals:   01/17/16 1641  BP: 152/100  Pulse: 94  Temp: 98.5 F (36.9 C)  TempSrc: Oral  Resp: 16  Height:  (1.676 m)  Weight: 122.471 kg  SpO2: 100%    MDM  Here for evaluation of dental pain. On exam, there is no evidence of a drainable abscess. No trismus, glossal elevation, unilateral tonsillar  swelling. No evidence of retropharyngeal or peritonsillar abscess or Ludwig angina. Patient refuses dental block in the ED. Discharged with outpatient dental resources as well as referral to on-call dentistry. Also given prescription for anti-inflammatories and antibiotics. Overall, appears well, nontoxic and appropriate for discharge.  Final diagnoses:  Pain, dental        Joycie Peek, PA-C 01/17/16 1854  Nelva Nay, MD 01/17/16 [phone]

## 2016-01-17 NOTE — Discharge Instructions (Signed)
Please follow-up with dentistry for definitive care. Take your antibiotics as prescribed. You may also use ibuprofen and Tylenol for your discomfort.  [facility]  Community Service Learning [facility]  [address]  [place], [place] [address]  Phone [phone]  [facility] [employer] [facility] in [place], [place], exemplifies the [employer] vision to improve the health and quality of life of all [place]ians by Public house manager with a passion to care for the underserved and by leading the nation in community-based, service learning oral health education. We are committed to offering comprehensive general dental services for adults, children and special needs patients in a safe, caring and professional setting.  Appointments: Our clinic is open Monday through Friday 8:00 a.m. until 5:00 p.m. The amount of time scheduled for an appointment depends on the patients specific needs. We ask that you keep your appointed time for care or provide 24-hour notice of all appointment changes. Parents or legal guardians must accompany minor children.  Payment for Services: Medicaid and other insurance plans are welcome. Payment for services is due when services are rendered and may be made by cash or credit card. If you have dental insurance, we will assist you with your claim submission.   Emergencies: Emergency services will be provided Monday through Friday on a walk-in basis. Please arrive early for emergency services. After hours emergency services will be provided for patients of record as required.  Services:  Comprehensive General Dentistry  Childrens Dentistry  Oral Surgery - Extractions  Root Canals  Sealants and Tooth Colored Fillings  Crowns and Bridges  Dentures and Partial Dentures  Implant Services  Periodontal Services and Glass blower/designer  3-D/[facility]   Emergency Department Resource Guide 1) Find a Doctor and Pay Out of Pocket Although you won't have to find out who is covered by your insurance plan, it is a good idea to ask around and get recommendations. You will then need to call the office and see if the doctor you have chosen will accept you as a new patient and what types of options they offer for patients who are self-pay. Some doctors offer discounts or will set up payment plans for their patients who do not have insurance, but you will need to ask so you aren't surprised when you get to your appointment.  2) Contact Your Local Health Department Not all health departments have doctors that can see patients for sick visits, but many do, so it is worth a call to see if yours does. If you don't know where your local health department is, you can check in your phone book. The CDC also has a tool to help you locate your state's health department, and many state websites also have listings of all of their local health departments.  3) Find a Walk-in Clinic If your illness is not likely to be very severe or complicated, you may want to try a walk in clinic. These are popping up all over the country in pharmacies, drugstores, and shopping centers. They're usually staffed by nurse practitioners or physician assistants that have been trained to treat common illnesses and complaints. They're usually fairly quick and inexpensive. However, if you have serious medical issues or chronic medical problems, these are probably not your best option.  No Primary Care Doctor: - Call Health Connect at  [phone] - they can help you  locate a primary care doctor that  accepts your insurance, provides certain services, etc. - Physician Referral Service- [phone]  Chronic Pain Problems: Organization         Address  Phone   Notes  [facility]  [phone] Patients need to be  referred by their primary care doctor.   Medication Assistance: Organization         Address  Phone   Notes  [facility] Medication [facility] [address] [place] [place], [place] [address] [phone] --Must be a resident of [facility] -- Must have NO insurance coverage whatsoever (no Medicaid/ Medicare, etc.) -- The pt. MUST have a primary care doctor that directs their care regularly and follows them in the community   MedAssist  [phone]   [employer]  [phone]    Agencies that provide inexpensive medical care: Organization         Address  Phone   Notes  [facility]  [phone]   [facility]    [phone]   [facility] [address] [place], [place] [address] [phone]   [facility] [address] [place] [place] [phone]   Planned Parenthood    [phone]   [facility]    [phone]   [facility]  [address], [place] Phone:  [phone], Fax:  [phone] Hours of Operation:  9 am - 6 pm, M-F.  Also accepts Medicaid/Medicare and self-pay.  [facility]  [address], [place] Phone: [phone], Fax: [phone]. Hours of Operation:  8:30 am - 5:30 pm, M-F.  Also accepts Medicaid and self-pay.  [facility] [place] [address] [place] Phone: [phone]   [facility] [address] Natasha Bence [place], [place] [phone], Ext. [phone] Mondays & Thursdays: 7-9 AM.  First 15 patients are seen on a first come, first serve basis.    Medicaid-accepting [facility] Providers:  Organization         Address  Phone   Notes  [facility] [address], [place] [phone] Also accepts self-pay patients.  [facility] [address] Laurell Josephs [place], [place]  [phone]   [facility] [address], [place] [phone]   [facility] [address], [place] [phone]   Renaye Rakers [address], [place]   [phone] Only accepts [place] Access [place] patients after they have their name applied to their card.   Self-Pay (no insurance) in [facility]:  Organization         Address  Phone   Notes  Sickle Cell Patients, [facility] Internal Medicine [address] [place], [place] [phone]   [facility] [address] [place], [place] [phone]   [facility] [place]  [address], [place] [phone]   [facility]/Dr. Osei-Bonsu  [address] [place] or [address], [place] [phone] Phone number for both [place] and [place] locations is the same.  [facility] [address], [place] [phone]   [facility] [address] [place] or [address] [phone])  [phone]   [facility] [address], [place] [phone], phone; [phone], fax Sees patients 1st and 3rd Saturday of every month.  Must not qualify for public or private insurance (i.e. Medicaid, Medicare, [place] Health Choice, Veterans' Benefits)  Household income should be no more than 200% of the poverty level The clinic cannot treat you if you are pregnant or think you are pregnant  Sexually transmitted diseases are not treated at the clinic.    Dental Care: Organization         Address  Phone  Notes  [facility] Department of [facility] [address] [place], [place] [phone] Accepts children up to age 21 who are enrolled in [place] or [place] Health Choice; pregnant women with a Medicaid card; and children who have applied for Medicaid or [place] Health Choice, but were declined, whose parents can  pay a reduced fee at time of service.  [facility] Department of [facility]  [address], [place] [phone] Accepts children up to age 36 who are enrolled in [place] or [place] Health Choice; pregnant women with a Medicaid card; and children who have applied for Medicaid or [place] Health Choice, but were declined, whose parents can pay a reduced fee at time of service.  [facility]  [address] [place], [place] [phone] Patients are seen by appointment only. Walk-ins are not accepted. [facility] will see patients 24 years of age and older. Monday - Tuesday (8am-5pm) Most Wednesdays (8:30-5pm) $30 per visit, cash only  [facility] Adult Dental Access [facility]  [address], [facility] [phone] Patients are seen by appointment only. Walk-ins are not accepted. [facility] will see patients 18 years of age and older. One Wednesday Evening (Monthly: Volunteer Based).  $30 per visit, cash only  [employer]  [phone] for adults; Children under age 67, call Graduate Pediatric Dentistry at [phone]. Children aged 24-14, please call [phone] to request a pediatric application.  Dental services are provided in all areas of dental care including fillings, crowns and bridges, complete and partial dentures, implants, gum treatment, root canals, and extractions. Preventive care is also provided. Treatment is provided to both adults and children. Patients are selected via a lottery and there is often a waiting list.   [facility] [address] [place]  [phone] [email or web address]   [facility] [address] [place], [place] [phone], Ext. [phone] Second and Fourth Thursday of each month, opens at 6:30 AM; Clinic ends at 9 AM.  Patients are seen on a first-come first-served basis, and a limited number are seen during each clinic.   [facility]  [address] Ether Griffins [place], [place] [phone]   Eligibility Requirements You must have lived in [place], [place], or [place] counties for at least the last three months.   You cannot be eligible for state or federal sponsored [place], including [employer], [place], or [employer].   You generally cannot be eligible for healthcare insurance through your employer.    How to apply: Eligibility screenings are held every Tuesday and Wednesday afternoon from 1:00 pm until 4:00 pm. You do not need an appointment for the interview!  [facility] [address], [place], [place] [phone]   [facility] Health Department  [phone]   [facility] Health Department  [phone]  [facility] Department  [phone]    Behavioral Health Resources in the Community: Intensive Outpatient Programs Organization         Address  Phone  Notes  [facility] [address] [place], [place] [phone]   [facility] [address], [place], [place] [phone]   [facility] [address] [place], [place]  [phone]   [facility] Mental Health [address]  [place], [place] [phone] or [phone]   Substance Abuse Resources Organization         Address  Phone  Notes  [facility] Drug Services  [phone]   [facility]  [phone]   [facility]  [phone]   Floydene Flock  [phone]   Residential & Outpatient Substance Abuse Program  [phone]   Psychological Services Organization         Address  Phone  Notes  [facility] Health  [phone]   [facility] Services  [phone]   [facility] Mental Health [address] [place] [phone] or [phone]    Mobile Crisis Teams Organization         Address  Phone  Notes  [facility], Mobile Crisis Care Unit  [phone]    [facility]  [address] [place], [place] [phone]   Doristine Locks [address] [place] [phone]    Self-Help/Support Groups Organization         Address  Phone             Notes  [facility] [place] - variety of support groups  336- I7437963 Call for more information  Narcotics Anonymous (NA), [facility] [address], [employer] [place]  2 meetings at this location   Statistician         Address  Phone  Notes  [facility] [address],    [place]  [phone]   [facility]  [address], [place] [address], [place], [place] [phone]   [facility] [address] [place], [place] [phone] Admissions: 8am-3pm M-F  [facility] [address],    [place], [place] [phone]   [facility] [address] [place], [place], [place] [phone]   [facility] [address],  [place], [place] [phone]   [facility] - Intensive Outpatient [address] Laurell Josephs [address], [place], [place] [phone]   [facility] (Addiction Recovery [facility] [address] [place]  [place], [place] [phone] or [phone]   [facility] ([facility]) [address], [place], [place] [phone] Accepts Medicaid  [facility] [address]  [place] [phone] Substance Abuse/Addiction Treatment   [facility] Organization         Address  Phone  Notes  [facility]  [phone]   Angie Fava, PhD [address] Ervin Knack [place], [place]   [phone] or [phone]   [facility]   [address] [place], [place] [phone]   [facility] [address], [place], [place] [phone] Insurance/Medicaid/sponsorship through [employer] and Families [address]                                     [place], [place] [phone]  [place], [place] [phone]    Dr. Adele Schilder  [phone]   [facility] Dept. 1) [address], [place] 2) [place] 3)  [address], [place] [phone]  [phone]   [place] [phone] or [phone] (After Hours)

## 2016-02-06 ENCOUNTER — Emergency Department (HOSPITAL_BASED_OUTPATIENT_CLINIC_OR_DEPARTMENT_OTHER): Payer: Medicaid Other

## 2016-02-06 ENCOUNTER — Emergency Department (HOSPITAL_BASED_OUTPATIENT_CLINIC_OR_DEPARTMENT_OTHER)
Admission: EM | Admit: 2016-02-06 | Discharge: 2016-02-06 | Disposition: A | Discharge status: Home / Self Care | Payer: Medicaid Other | Attending: Emergency Medicine | Admitting: Emergency Medicine

## 2016-02-06 ENCOUNTER — Encounter (HOSPITAL_BASED_OUTPATIENT_CLINIC_OR_DEPARTMENT_OTHER): Payer: Self-pay | Admitting: *Deleted

## 2016-02-06 DIAGNOSIS — R222 Localized swelling, mass and lump, trunk: Secondary | ICD-10-CM | POA: Diagnosis not present

## 2016-02-06 DIAGNOSIS — Z8719 Personal history of other diseases of the digestive system: Secondary | ICD-10-CM | POA: Diagnosis not present

## 2016-02-06 DIAGNOSIS — N63 Unspecified lump in breast: Secondary | ICD-10-CM | POA: Diagnosis present

## 2016-02-06 MED ORDER — IBUPROFEN 800 MG PO TABS: 800.0000 mg | ORAL_TABLET | Freq: Three times a day (TID) | ORAL | Status: AC

## 2016-02-06 MED ORDER — ACETAMINOPHEN 500 MG PO TABS: 500.0000 mg | ORAL_TABLET | Freq: Four times a day (QID) | ORAL | Status: AC | PRN

## 2016-02-06 NOTE — Discharge Instructions (Signed)
1. Medications: tylenol or motrin for pain, usual home medications 2. Treatment: rest, drink plenty of fluids 3. Follow Up: please followup with your primary doctor (call tomorrow) to set up outpatient ultrasound and for discussion of your diagnoses and further evaluation after today's visit; please return to the ER for new or worsening symptoms

## 2016-02-06 NOTE — ED Notes (Signed)
Reports 'lump' on chest since jan 26.  Reports pain on palpation on lump.  No obvious lump noted in triage.

## 2016-02-06 NOTE — ED Notes (Signed)
Pt evaluated by PCP on Feb 21st and was put on antibiotics, which she reports she's currently taking as prescribed. States she's seeking treatment at this time d/t soreness. Denies taking prescription or OTC pain meds.

## 2016-02-06 NOTE — ED Provider Notes (Signed)
CSN: [id number]     Arrival date & time 02/06/16  1337 History   First MD Initiated Contact with Patient 02/06/16 1535     Chief Complaint  Patient presents with  . Breast Mass    HPI   Selena Boyd is a 30 y.o. female with a PMH of GERD who presents to the ED with "lump" on her chest, which she states she first noticed in January. She notes she saw her PCP on the 21st, and was placed on antibiotics (keflex) for possible abscess. She denies change in symptoms, though reports persistent discomfort. She reports applying pressure exacerbates her pain. She has not tried anything for symptom relief. She denies fever, chills, nausea, vomiting.   Past Medical History  Diagnosis Date  . GERD (gastroesophageal reflux disease)    Past Surgical History  Procedure Laterality Date  . Cesarean section    . Laparoscopy     History reviewed. No pertinent family history. Social History  Substance Use Topics  . Smoking status: Never Smoker   . Smokeless tobacco: Never Used  . Alcohol Use: No   OB History    No data available      Review of Systems  Constitutional: Negative for fever and chills.  Gastrointestinal: Negative for nausea and vomiting.  Skin:       Lump to chest wall.      Allergies  Review of patient's allergies indicates no known allergies.  Home Medications   Prior to Admission medications   Medication Sig Start Date End Date Taking? Authorizing Provider  acetaminophen (TYLENOL) 500 MG tablet Take 1 tablet (500 mg total) by mouth every 6 (six) hours as needed. 02/06/16   Mady Gemma, PA-C  ibuprofen (ADVIL,MOTRIN) 400 MG tablet Take 1 tablet (400 mg total) by mouth every 6 (six) hours as needed. 01/17/16   Joycie Peek, PA-C  ibuprofen (ADVIL,MOTRIN) 800 MG tablet Take 1 tablet (800 mg total) by mouth 3 (three) times daily. 02/06/16   Dorise Hiss [place], PA-C    BP 149/100 mmHg  Pulse 85  Temp(Src) 98 F (36.7 C) (Oral)  Resp 18  Ht  (1.676 m)   Wt 127.461 kg  BMI 45.38 kg/m2  SpO2 100%  LMP 01/29/2016 Physical Exam  Constitutional: She is oriented to person, place, and time. She appears well-developed and well-nourished. No distress.  HENT:  Head: Normocephalic and atraumatic.  Right Ear: External ear normal.  Left Ear: External ear normal.  Nose: Nose normal.  Eyes: Conjunctivae and EOM are normal. Right eye exhibits no discharge. Left eye exhibits no discharge. No scleral icterus.  Neck: Normal range of motion. Neck supple.  Cardiovascular: Normal rate and regular rhythm.   Pulmonary/Chest: Effort normal and breath sounds normal. No respiratory distress. She exhibits tenderness.    TTP to anterior chest wall with palpable mass.  Musculoskeletal: Normal range of motion. She exhibits no edema or tenderness.  Neurological: She is alert and oriented to person, place, and time.  Skin: Skin is warm and dry. She is not diaphoretic.  Psychiatric: She has a normal mood and affect. Her behavior is normal.  Nursing note and vitals reviewed.   ED Course  Procedures (including critical care time)  Labs Review Labs Reviewed - No data to display  Imaging Review Dg Chest 2 View  02/06/2016  CLINICAL DATA:  LEFT anterior chest wall swelling lateral to sternum, pain with palpation, symptoms since January 2017 EXAM: CHEST  2 VIEW COMPARISON:  09/04/2015 FINDINGS:  Borderline enlargement of cardiac silhouette. Mediastinal contours and pulmonary vascularity normal. Lungs clear. No pleural effusion or pneumothorax. Bones unremarkable. No definite retrosternal soft tissue density identified within limitations of body habitus. IMPRESSION: No definite acute abnormalities. Electronically Signed   By: Ulyses Southward M.D.   On: 02/06/2016 16:37    I have personally reviewed and evaluated these images and lab results as part of my medical decision-making.   EKG Interpretation None      MDM   Final diagnoses:  Chest wall mass     30 year old female presents with "lump" to her chest wall. Was evaluated by her PCP and placed on keflex for this. Reports persistent discomfort. Patient is afebrile. Vital signs stable. On exam, patient has tenderness to palpation to her anterior chest wall with palpable mass. No fluctuance. Patient discussed with and seen by Dr. Madilyn Hook. Chest x-ray negative for definite acute abnormalities. Patient is nontoxic and well-appearing, feel she is stable for discharge at this time. Advised to take tylenol or motrin for pain. Patient to follow-up with PCP for outpatient ultrasound for further evaluation and management of symptoms. Discussed importance of follow-up at length. Patient verbalizes her understanding and is in agreement with plan. Return precautions given.   BP 149/100 mmHg  Pulse 85  Temp(Src) 98 F (36.7 C) (Oral)  Resp 18  Ht  (1.676 m)  Wt 127.461 kg  BMI 45.38 kg/m2  SpO2 100%  LMP 01/29/2016     Mady Gemma, PA-C 02/06/16 1652  [place]en Fossa, MD 02/07/16 1329

## 2016-08-23 IMAGING — CR DG CHEST 2V
2 series · 2 of 2 positions shown · non-contrast
Comparison: 12/21/2013

CLINICAL DATA: Initial encounter for cough for 1 week.  Nonsmoker.

EXAM:
CHEST  2 VIEW

[w chest pa]
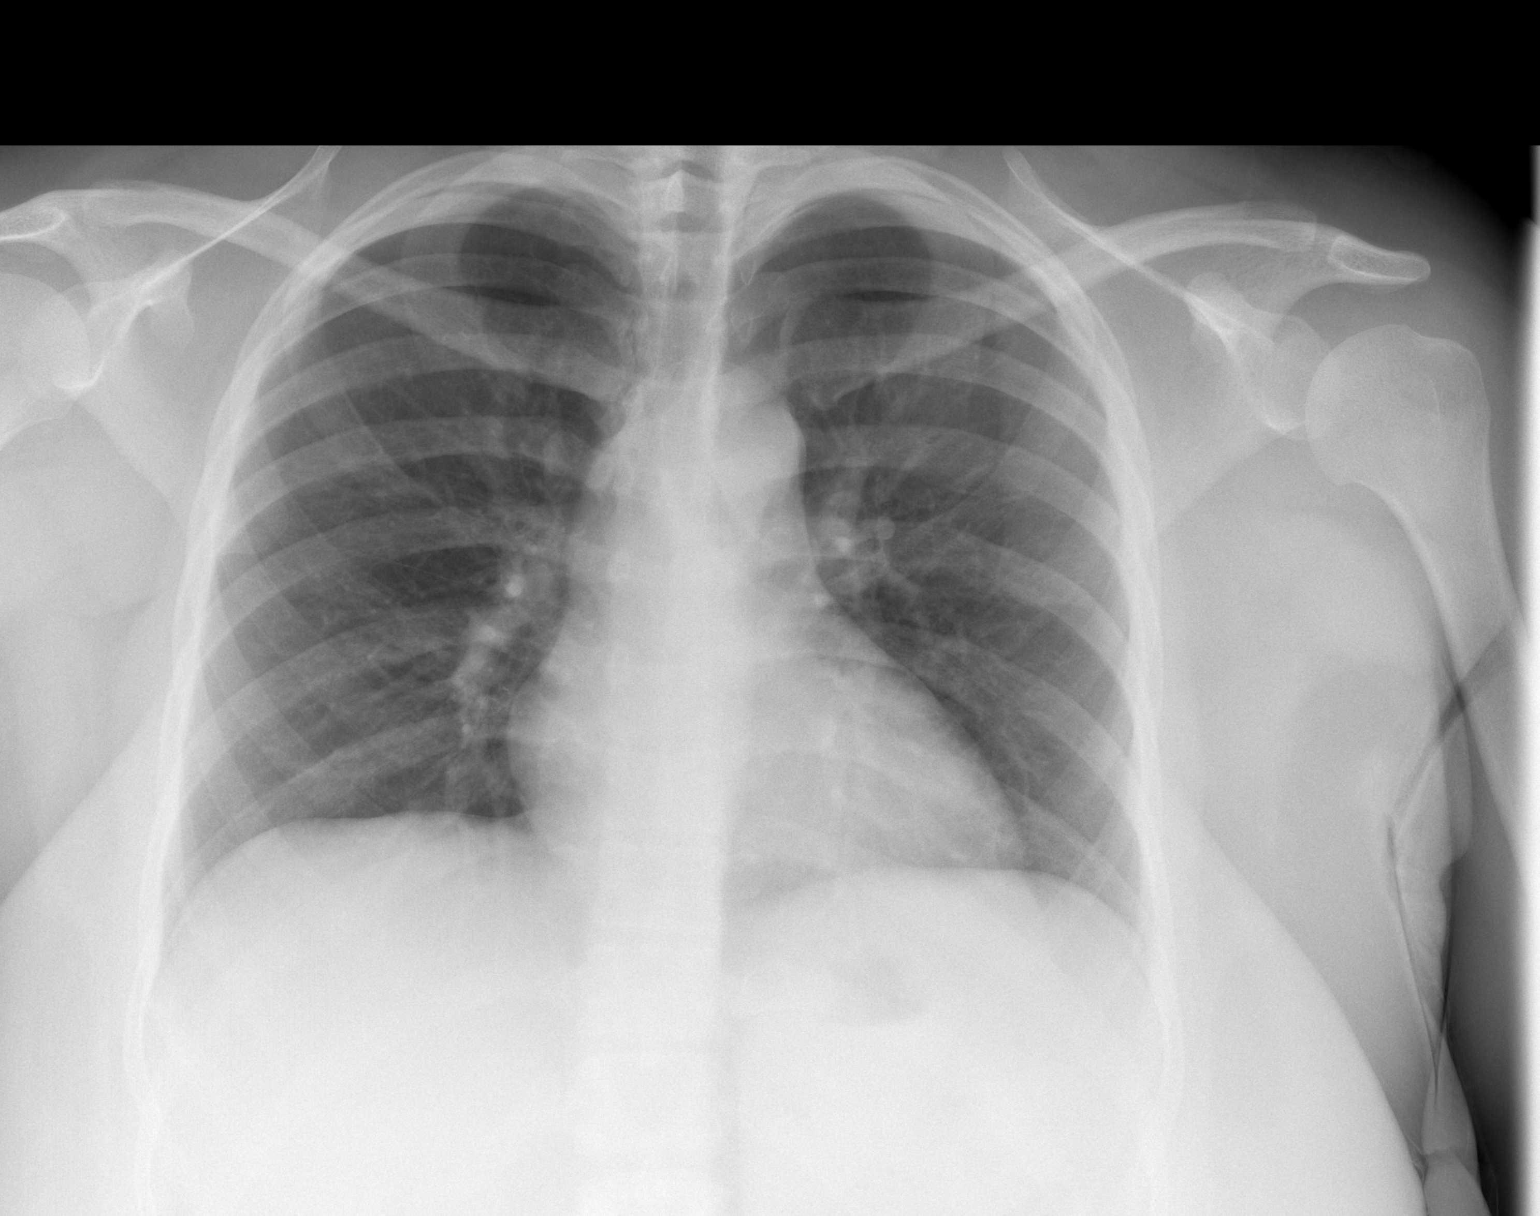

[w chest lat]
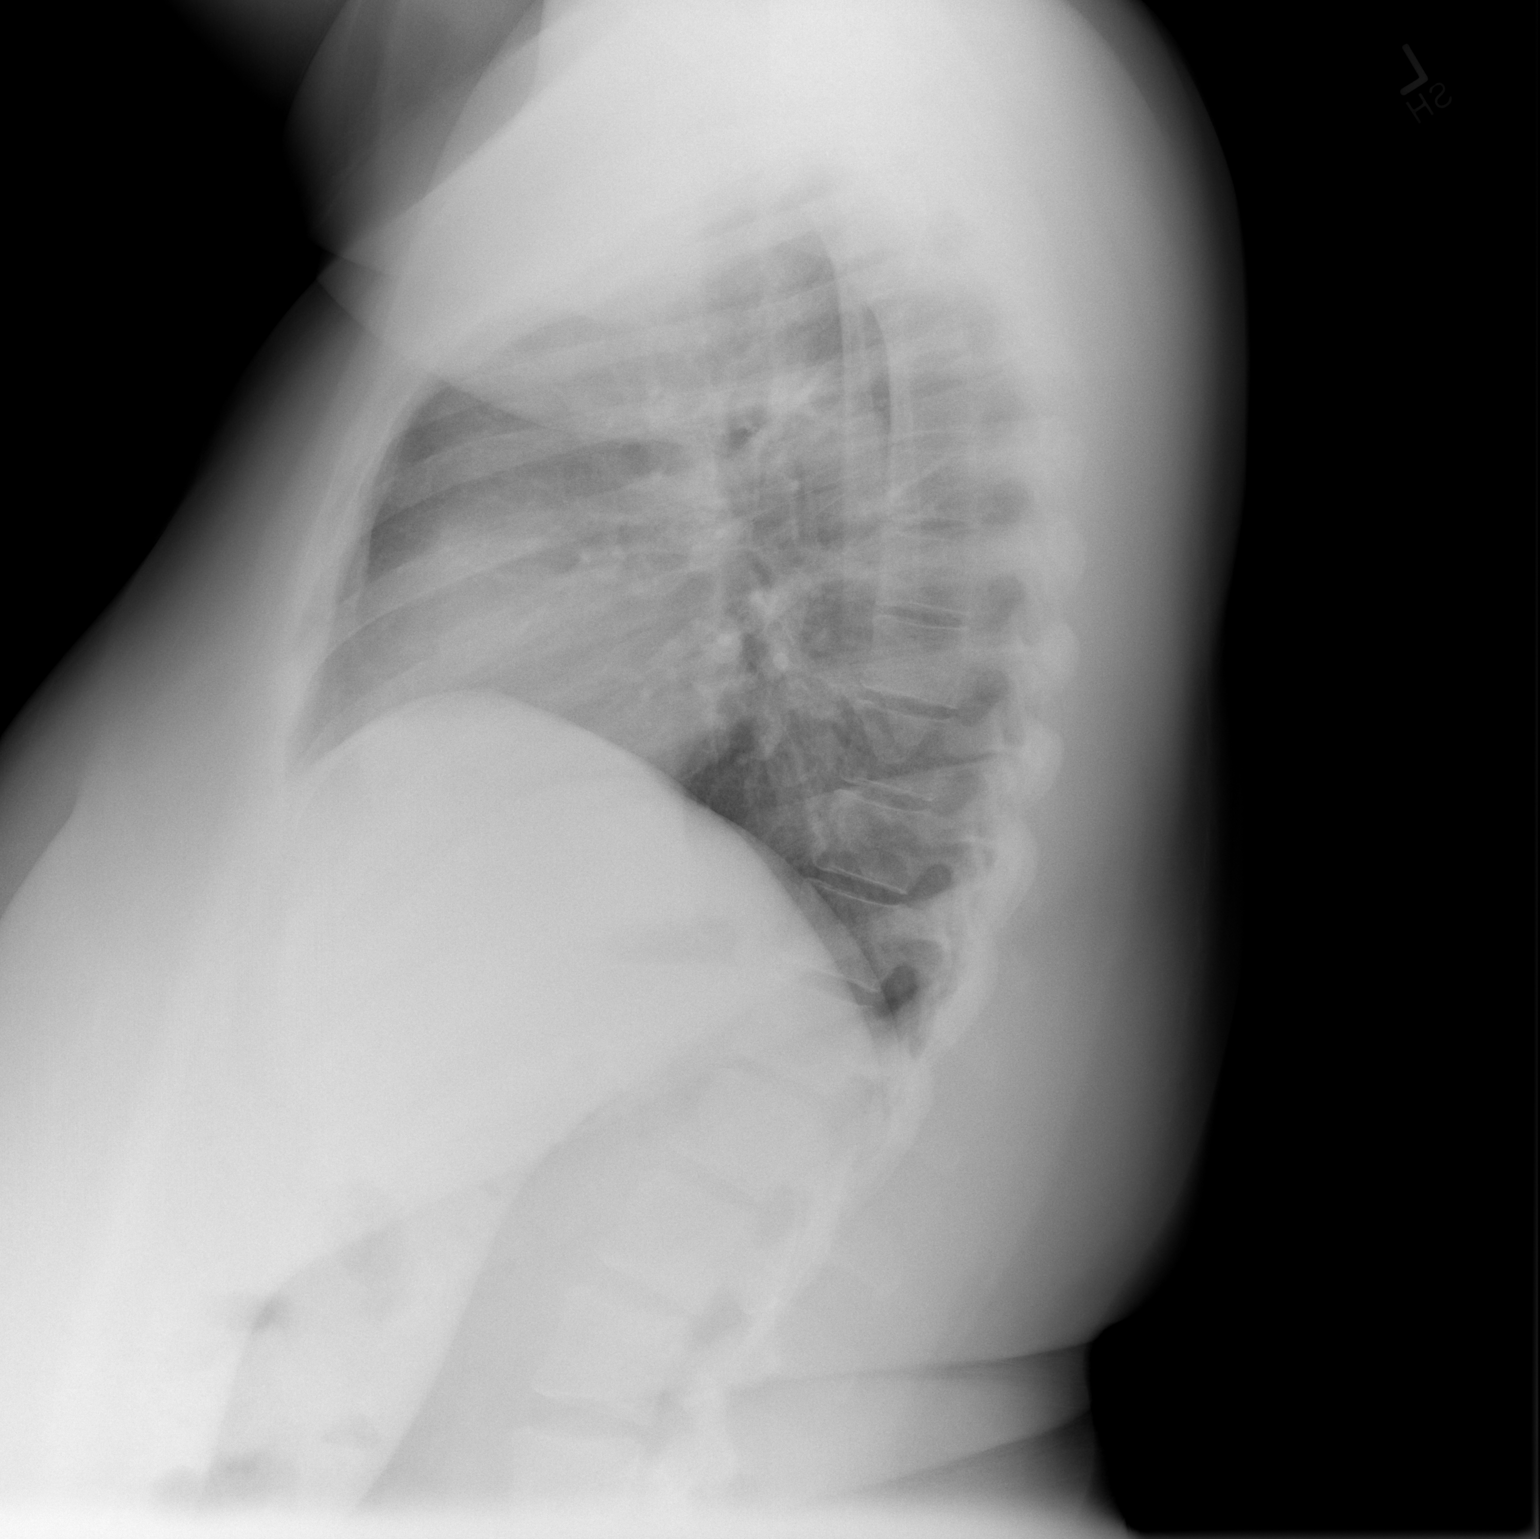

[2 of 2 positions shown; findings below may reference images not displayed]

FINDINGS: Midline trachea.  Normal heart size and mediastinal contours.

Sharp costophrenic angles.  No pneumothorax.  Clear lungs.

Lateral view degraded by patient arm position.
IMPRESSION: No active cardiopulmonary disease.

## 2017-05-15 ENCOUNTER — Emergency Department (HOSPITAL_BASED_OUTPATIENT_CLINIC_OR_DEPARTMENT_OTHER)
Admission: EM | Admit: 2017-05-15 | Discharge: 2017-05-15 | Disposition: A | Discharge status: Home / Self Care | Payer: Medicaid Other | Attending: Physician Assistant | Admitting: Physician Assistant

## 2017-05-15 ENCOUNTER — Emergency Department (HOSPITAL_BASED_OUTPATIENT_CLINIC_OR_DEPARTMENT_OTHER): Payer: Medicaid Other

## 2017-05-15 ENCOUNTER — Encounter (HOSPITAL_BASED_OUTPATIENT_CLINIC_OR_DEPARTMENT_OTHER): Payer: Self-pay | Admitting: *Deleted

## 2017-05-15 DIAGNOSIS — R0789 Other chest pain: Secondary | ICD-10-CM | POA: Diagnosis not present

## 2017-05-15 DIAGNOSIS — R079 Chest pain, unspecified: Secondary | ICD-10-CM | POA: Diagnosis present

## 2017-05-15 DIAGNOSIS — R0602 Shortness of breath: Secondary | ICD-10-CM | POA: Insufficient documentation

## 2017-05-15 HISTORY — DX: Pure hypercholesterolemia, unspecified: E78.00

## 2017-05-15 HISTORY — DX: Anemia, unspecified: D64.9

## 2017-05-15 LAB — TROPONIN I: Troponin I: <0.03 ng/mL (ref <0.03)

## 2017-05-15 LAB — COMPREHENSIVE METABOLIC PANEL
ALBUMIN: 3.5 g/dL (ref 3.5–5.0)
ALK PHOS: 50 U/L (ref 38–126)
ALT: 12 U/L — AB (ref 14–54)
AST: 15 U/L (ref 15–41)
Anion gap: 8 (ref 5–15)
BILIRUBIN TOTAL: 0.2 mg/dL — AB (ref 0.3–1.2)
BUN: 12 mg/dL (ref 6–20)
CALCIUM: 9 mg/dL (ref 8.9–10.3)
CO2: 22 mmol/L (ref 22–32)
CREATININE: 0.78 mg/dL (ref 0.44–1.00)
Chloride: 107 mmol/L (ref 101–111)
GFR calc Af Amer: >60 mL/min (ref >60)
GFR calc non Af Amer: >60 mL/min (ref >60)
GLUCOSE: 132 mg/dL — AB (ref 65–99)
Potassium: 3.9 mmol/L (ref 3.5–5.1)
Sodium: 137 mmol/L (ref 135–145)
TOTAL PROTEIN: 7.4 g/dL (ref 6.5–8.1)

## 2017-05-15 LAB — CBC WITH DIFFERENTIAL/PLATELET
Basophils Absolute: 0 10*3/uL (ref 0.0–0.1)
Basophils Relative: 0 %
Eosinophils Absolute: 0.2 10*3/uL (ref 0.0–0.7)
Eosinophils Relative: 3 %
HEMATOCRIT: 35.1 % — AB (ref 36.0–46.0)
HEMOGLOBIN: 11.5 g/dL — AB (ref 12.0–15.0)
Lymphocytes Relative: 36 %
Lymphs Abs: 2.2 10*3/uL (ref 0.7–4.0)
MCH: 27.8 pg (ref 26.0–34.0)
MCHC: 32.8 g/dL (ref 30.0–36.0)
MCV: 85 fL (ref 78.0–100.0)
MONOS PCT: 8 %
Monocytes Absolute: 0.5 10*3/uL (ref 0.1–1.0)
NEUTROS PCT: 53 %
Neutro Abs: 3.2 10*3/uL (ref 1.7–7.7)
Platelets: 520 10*3/uL — ABNORMAL HIGH (ref 150–400)
RBC: 4.13 MIL/uL (ref 3.87–5.11)
RDW: 14.7 % (ref 11.5–15.5)
WBC: 6.1 10*3/uL (ref 4.0–10.5)

## 2017-05-15 LAB — PREGNANCY, URINE: Preg Test, Ur: NEGATIVE

## 2017-05-15 LAB — D-DIMER, QUANTITATIVE: D-Dimer, Quant: 0.32 ug/mL-FEU (ref 0.00–0.50)

## 2017-05-15 MED ORDER — BUPIVACAINE-EPINEPHRINE (PF) 0.5% -1:200000 IJ SOLN
INTRAMUSCULAR | Status: DC
Start: 2017-05-15 — End: 2017-05-15
  Filled 2017-05-15: qty 1.8

## 2017-05-15 NOTE — Discharge Instructions (Signed)
Please follow up with your primary care. Return with any concerns.

## 2017-05-15 NOTE — ED Provider Notes (Signed)
[facility] Provider Note   CSN: [id number] Arrival date & time: 05/15/17  1553     History   Chief Complaint Chief Complaint  Patient presents with  . Chest Pain    HPI Selena Boyd is a 31 y.o. female.  HPI   Patient is a 31 year old female with chest pain for last month on and off. Patient's been seen by 2 other providers for this. Patient reports that she was prescribed lipid medication.  Patient reports it's worse with walking. She has increasing shortness of breath and chest pain when walking. On both sides. Has noticed no leg swelling. Not on any estrogens. Medical cardiac history.  No recent travel car or plane. Past Medical History:  Diagnosis Date  . Anemia   . GERD (gastroesophageal reflux disease)   . High cholesterol     There are no active problems to display for this patient.   Past Surgical History:  Procedure Laterality Date  . CESAREAN SECTION    . LAPAROSCOPY      OB History    No data available       Home Medications    Prior to Admission medications   Medication Sig Start Date End Date Taking? Authorizing Provider  acetaminophen (TYLENOL) 500 MG tablet Take 1 tablet (500 mg total) by mouth every 6 (six) hours as needed. 02/06/16   Mady GemmaWestfall, Elizabeth C, PA-C  ibuprofen (ADVIL,MOTRIN) 400 MG tablet Take 1 tablet (400 mg total) by mouth every 6 (six) hours as needed. 01/17/16   Cartner, Sharlet SalinaBenjamin, PA-C  ibuprofen (ADVIL,MOTRIN) 800 MG tablet Take 1 tablet (800 mg total) by mouth 3 (three) times daily. 02/06/16   Mady GemmaWestfall, Elizabeth C, PA-C    Family History No family history on file.  Social History Social History  Substance Use Topics  . Smoking status: Never Smoker  . Smokeless tobacco: Never Used  . Alcohol use No     Allergies   Patient has no known allergies.   Review of Systems Review of Systems  Constitutional: Negative for activity change.  Respiratory: Positive for chest tightness and shortness of breath.    Cardiovascular: Positive for chest pain.  Gastrointestinal: Negative for abdominal pain.     Physical Exam Updated Vital Signs BP 124/80   Pulse 78   Temp 97.8 F (36.6 C) (Oral)   Resp 20   Ht 5\' 6"  (1.676 m)   Wt 129.3 kg (285 lb)   LMP 05/05/2017   SpO2 100%   BMI 46.00 kg/m   Physical Exam  Constitutional: She is oriented to person, place, and time. She appears well-developed and well-nourished.  Morbidly obese.  HENT:  Head: Normocephalic and atraumatic.  Eyes: Right eye exhibits no discharge.  Cardiovascular: Normal rate, regular rhythm and normal heart sounds.   No murmur heard. Pulmonary/Chest: Effort normal and breath sounds normal. She has no wheezes. She has no rales.  Abdominal: Soft. She exhibits no distension. There is no tenderness.  Musculoskeletal:  Equal leg size.  Neurological: She is oriented to person, place, and time.  Skin: Skin is warm and dry. She is not diaphoretic.  Psychiatric: She has a normal mood and affect.  Nursing note and vitals reviewed.    ED Treatments / Results  Labs (all labs ordered are listed, but only abnormal results are displayed) Labs Reviewed  CBC WITH DIFFERENTIAL/PLATELET - Abnormal; Notable for the following:       Result Value   Hemoglobin 11.5 (*)    HCT 35.1 (*)  Platelets 520 (*)    All other components within normal limits  COMPREHENSIVE METABOLIC PANEL - Abnormal; Notable for the following:    Glucose, Bld 132 (*)    ALT 12 (*)    Total Bilirubin 0.2 (*)    All other components within normal limits  TROPONIN I  PREGNANCY, URINE  D-DIMER, QUANTITATIVE (NOT AT [facility])    EKG  EKG Interpretation  Date/Time:  Tuesday May 15 2017 16:04:07 EDT Ventricular Rate:  97 PR Interval:  152 QRS Duration: 78 QT Interval:  342 QTC Calculation: 434 R Axis:   37 Text Interpretation:  Normal sinus rhythm Reconfirmed by Corlis Leak, Noemi Bellissimo ([id number]) on 05/15/2017 4:43:08 PM       Radiology Dg Chest 2  View  Result Date: 05/15/2017 CLINICAL DATA:  Chest pain EXAM: CHEST  2 VIEW COMPARISON:  02/06/2016 FINDINGS: Normal heart size. Lungs clear. No pneumothorax. No pleural effusion. IMPRESSION: No active cardiopulmonary disease. Electronically Signed   By: Jolaine Click M.D.   On: 05/15/2017 16:43    Procedures Procedures (including critical care time)  Medications Ordered in ED Medications - No data to display   Initial Impression / Assessment and Plan / ED Course  I have reviewed the triage vital signs and the nursing notes.  Pertinent labs & imaging results that were available during my care of the patient were reviewed by me and considered in my medical decision making (see chart for details).     Very well-appearing 31 year old female presenting with intermittent chest pain over the last month. Worse with walking. Patient is morbidly obese. Concern for pulmonary embolism. We'll send d-dimer. Patient does not have any risk factors , but patient has been seen multiple times for this without solution. Will get chest x-ray as well. Labs. Patient currently appears very well. Lungs are normal.  D Dimer negative.  Xray normal. Will discharge home with follow up for platelets and anemai with PCP as laready planned.   Final Clinical Impressions(s) / ED Diagnoses   Final diagnoses:  Other chest pain    New Prescriptions Discharge Medication List as of 05/15/2017  5:24 PM       Abelino Derrick, MD 05/15/17 2236

## 2017-05-15 NOTE — ED Triage Notes (Addendum)
Chest pain off and on for a month. Mild nagging pain. States her lab work showed low platelets and anemia a month ago.

## 2017-05-22 ENCOUNTER — Ambulatory Visit: Payer: Self-pay | Admitting: Podiatry

## 2017-05-29 ENCOUNTER — Encounter: Payer: Self-pay | Admitting: Podiatry

## 2017-05-29 ENCOUNTER — Ambulatory Visit (INDEPENDENT_AMBULATORY_CARE_PROVIDER_SITE_OTHER): Payer: Medicaid Other | Admitting: Podiatry

## 2017-05-29 VITALS — Ht 66.0 in | Wt 285.0 lb

## 2017-05-29 DIAGNOSIS — M216X2 Other acquired deformities of left foot: Secondary | ICD-10-CM | POA: Diagnosis not present

## 2017-05-29 DIAGNOSIS — M722 Plantar fascial fibromatosis: Secondary | ICD-10-CM | POA: Diagnosis not present

## 2017-05-29 DIAGNOSIS — M21962 Unspecified acquired deformity of left lower leg: Secondary | ICD-10-CM

## 2017-05-29 NOTE — Progress Notes (Signed)
SUBJECTIVE: 31 y.o. year old female presents complaining of pain under arch, heel and inner side ankle area duration of 2 months. Got worse in early part of June. Initial pain started in mid April. Now pain is not as bad. The most pain is in the morning as she hit the floor from bed. On feet at home or to walk, but the foot pain is stopping her from doing her regular chore. Patient is 5'6" tall and weigh 285 lbs.  High Cholesterol, Anemic,  REVIEW OF SYSTEMS: A comprehensive review of systems was negative except for: High cholesterol and anemic.   OBJECTIVE: DERMATOLOGIC EXAMINATION: No abnormal skin lesions.  VASCULAR EXAMINATION OF LOWER LIMBS: All pedal pulses are palpable with normal pulsation.  Capillary Filling times within 3 seconds in all digits.  No visible edema or erythema noted.  NEUROLOGIC EXAMINATION OF THE LOWER LIMBS: All epicritic and tactile sensations grossly intact. Sharp and Dull discriminatory sensations at the plantar ball of hallux is intact bilateral.   MUSCULOSKELETAL EXAMINATION: Positive for excess sagittal plane motion of the first ray L>R, forefoot varus, STJ hyperpronation on left.   RADIOGRAPHIC STUDIES:  AP View:  Short first metatarsal left foot. Lateral view of left foot:  Pronated foot with midtarsal sagging, positive for plantar calcaneal spur, and elevated first ray.  ASSESSMENT: Plantar fasciitis left. Hyperpronation STJ left. Metatarsal deformity, short and elevated left.  PLAN: Reviewed clinical findings and available treatment options, injection, orthotics OTC/Custom, Night Splint, Surgical. Patient refused to get injection.  Patient will return for further treatment.

## 2017-05-29 NOTE — Patient Instructions (Addendum)
Seen for left heel pain. Noted of short elevated first metatarsal bone leading excess pronation on left foot. Reviewed available treatment options. May take Advil as needed.

## 2017-10-29 ENCOUNTER — Emergency Department (HOSPITAL_BASED_OUTPATIENT_CLINIC_OR_DEPARTMENT_OTHER)
Admission: EM | Admit: 2017-10-29 | Discharge: 2017-10-29 | Disposition: A | Discharge status: Home / Self Care | Payer: Medicaid Other | Attending: Emergency Medicine | Admitting: Emergency Medicine

## 2017-10-29 ENCOUNTER — Other Ambulatory Visit: Payer: Self-pay

## 2017-10-29 ENCOUNTER — Encounter (HOSPITAL_BASED_OUTPATIENT_CLINIC_OR_DEPARTMENT_OTHER): Payer: Self-pay | Admitting: *Deleted

## 2017-10-29 DIAGNOSIS — R05 Cough: Secondary | ICD-10-CM | POA: Insufficient documentation

## 2017-10-29 DIAGNOSIS — J45909 Unspecified asthma, uncomplicated: Secondary | ICD-10-CM | POA: Diagnosis not present

## 2017-10-29 DIAGNOSIS — R059 Cough, unspecified: Secondary | ICD-10-CM

## 2017-10-29 DIAGNOSIS — Z8709 Personal history of other diseases of the respiratory system: Secondary | ICD-10-CM

## 2017-10-29 DIAGNOSIS — Z79899 Other long term (current) drug therapy: Secondary | ICD-10-CM | POA: Insufficient documentation

## 2017-10-29 DIAGNOSIS — J302 Other seasonal allergic rhinitis: Secondary | ICD-10-CM | POA: Diagnosis not present

## 2017-10-29 MED ORDER — AEROCHAMBER PLUS FLO-VU MEDIUM MISC
1.0000 | Freq: Once | Status: AC
  Administered 2017-10-29: 1
  Filled 2017-10-29: qty 1

## 2017-10-29 MED ORDER — ALBUTEROL SULFATE HFA 108 (90 BASE) MCG/ACT IN AERS
2.0000 | INHALATION_SPRAY | RESPIRATORY_TRACT | Status: DC | PRN
  Administered 2017-10-29: 2 via RESPIRATORY_TRACT
  Filled 2017-10-29: qty 6.7

## 2017-10-29 NOTE — ED Triage Notes (Signed)
Pt reports cough x 1 month, occassionally producing thin clear sputum, denies fevers or other c/o.

## 2017-10-29 NOTE — Discharge Instructions (Signed)
Please use albuterol inhaler with spacer up to 2 puffs every 4 hours Follow-up with your primary care provider for further management and refill of medications Return here if you are worse at any time, especially worsening shortness of breath, chest pain, or fever.

## 2017-10-29 NOTE — ED Provider Notes (Signed)
[facility] EMERGENCY DEPARTMENT Provider Note   CSN: [id number] Arrival date & time: 10/29/17  0802     History   Chief Complaint Chief Complaint  Patient presents with  . Cough    HPI Selena Boyd is a 31 y.o. female.  HPI   31-year-old female presents today complaining of cough and nasal congestion for the past month.  She has a history of asthma but has been out of her albuterol.  She endorses some clear sputum production.  She denies any severe dyspnea, productive cough, or fever.  She also states that she has had some signs of seasonal allergies with some nasal congestion.  She feels these are consistent with her previous symptoms of seasonal allergies and asthma.  She is not sure when she last had her albuterol filled.  She has a primary care physician but states that she has not seen her for.  Had normal menstrual cycles with her last menstrual period being the end of October.  Denies any lateralized leg swelling, history of DVT, or PE.  Past Medical History:  Diagnosis Date  . Anemia   . GERD (gastroesophageal reflux disease)   . High cholesterol     There are no active problems to display for this patient.   Past Surgical History:  Procedure Laterality Date  . CESAREAN SECTION    . LAPAROSCOPY      OB History    No data available       Home Medications    Prior to Admission medications   Medication Sig Start Date End Date Taking? Authorizing Provider  acetaminophen (TYLENOL) 500 MG tablet Take 1 tablet (500 mg total) by mouth every 6 (six) hours as needed. 02/06/16   Mady GemmaWestfall, Elizabeth C, PA-C  albuterol (PROVENTIL HFA;VENTOLIN HFA) 108 (90 Base) MCG/ACT inhaler Inhale into the lungs. 10/17/16 10/17/17  [provider]  amLODipine (NORVASC) 10 MG tablet  04/17/17   [provider]  ferrous sulfate (SM IRON) 325 (65 FE) MG tablet Take 325 mg by mouth.    [provider]  ibuprofen (ADVIL,MOTRIN) 400 MG tablet Take 1  tablet (400 mg total) by mouth every 6 (six) hours as needed. Patient not taking: Reported on 05/29/2017 01/17/16   Joycie Peekartner, Benjamin, PA-C  ibuprofen (ADVIL,MOTRIN) 800 MG tablet Take 1 tablet (800 mg total) by mouth 3 (three) times daily. Patient not taking: Reported on 05/29/2017 02/06/16   Mady GemmaWestfall, Elizabeth C, PA-C  pravastatin (PRAVACHOL) 40 MG tablet Take 40 mg by mouth. 04/17/17 04/17/18  [provider]  Vitamin D, Ergocalciferol, (DRISDOL) 50000 units CAPS capsule Take by mouth. 04/17/17   [provider]    Family History History reviewed. No pertinent family history.  Social History Social History   Tobacco Use  . Smoking status: Never Smoker  . Smokeless tobacco: Never Used  Substance Use Topics  . Alcohol use: No  . Drug use: No     Allergies   Patient has no known allergies.   Review of Systems Review of Systems  Constitutional: Negative.   HENT: Negative.   Eyes: Negative.   Respiratory: Positive for cough. Negative for choking, chest tightness, shortness of breath, wheezing and stridor.   Cardiovascular: Negative.   Gastrointestinal: Negative.   Endocrine: Negative.   Genitourinary: Negative.   Musculoskeletal: Negative.   Skin: Negative.   Allergic/Immunologic: Negative.   Neurological: Negative.   Hematological: Negative.   Psychiatric/Behavioral: Negative.   All other systems reviewed and are negative.  Physical Exam Updated Vital Signs BP (!) 146/91 (BP Location: Right Arm)   Pulse 87   Temp 98.2 F (36.8 C) (Oral)   Resp 18   Ht 1.676 m (5\' 6" )   Wt 129.3 kg (285 lb)   LMP 10/26/2017   SpO2 100%   BMI 46.00 kg/m   Physical Exam  Constitutional: She is oriented to person, place, and time. She appears well-developed and well-nourished. No distress.  HENT:  Head: Normocephalic and atraumatic.  Right Ear: External ear normal.  Left Ear: External ear normal.  Nose: Nose normal.  Eyes: Conjunctivae and EOM are normal.  Pupils are equal, round, and reactive to light.  Neck: Normal range of motion. Neck supple.  Cardiovascular: Normal rate, regular rhythm, normal heart sounds and intact distal pulses.  Pulmonary/Chest: Effort normal and breath sounds normal. No stridor. No respiratory distress. She has no wheezes. She has no rales.  Abdominal: Soft. Bowel sounds are normal.  Musculoskeletal: Normal range of motion.  Neurological: She is alert and oriented to person, place, and time. She exhibits normal muscle tone. Coordination normal.  Skin: Skin is warm and dry.  Psychiatric: She has a normal mood and affect. Her behavior is normal. Thought content normal.  Nursing note and vitals reviewed.    ED Treatments / Results  Labs (all labs ordered are listed, but only abnormal results are displayed) Labs Reviewed - No data to display  EKG  EKG Interpretation None       Radiology No results found.  Procedures Procedures (including critical care time)  Medications Ordered in ED Medications  albuterol (PROVENTIL HFA;VENTOLIN HFA) 108 (90 Base) MCG/ACT inhaler 2 puff (not administered)  AEROCHAMBER PLUS FLO-VU MEDIUM MISC 1 each (not administered)     Initial Impression / Assessment and Plan / ED Course  I have reviewed the triage vital signs and the nursing notes.  Pertinent labs & imaging results that were available during my care of the patient were reviewed by me and considered in my medical decision making (see chart for details).     Patient with history of asthma and now with coughing.  Lungs are clear without wheezing here.  However this is likely secondary to her asthma.  We will restart over-the-counter seasonal allergy medications and she is given an albuterol HFA here and instructed to use it to 2 puffs every 4 hours.  We discussed the necessity of her follow-up with her primary care physician for further treatment and return precautions, including but not limited to ,worsening  dyspnea, fever, or chest pain.  Final Clinical Impressions(s) / ED Diagnoses   Final diagnoses:  Cough  Seasonal allergies  History of asthma    ED Discharge Orders    None       Margarita Grizzleay, Jennilee Demarco, MD 10/29/17 [phone]

## 2017-12-10 ENCOUNTER — Encounter (HOSPITAL_BASED_OUTPATIENT_CLINIC_OR_DEPARTMENT_OTHER): Payer: Self-pay | Admitting: *Deleted

## 2017-12-10 ENCOUNTER — Other Ambulatory Visit: Payer: Self-pay

## 2017-12-10 ENCOUNTER — Emergency Department (HOSPITAL_BASED_OUTPATIENT_CLINIC_OR_DEPARTMENT_OTHER)
Admission: EM | Admit: 2017-12-10 | Discharge: 2017-12-10 | Disposition: A | Discharge status: Home / Self Care | Payer: Medicaid Other | Attending: Physician Assistant | Admitting: Physician Assistant

## 2017-12-10 DIAGNOSIS — J020 Streptococcal pharyngitis: Secondary | ICD-10-CM | POA: Insufficient documentation

## 2017-12-10 DIAGNOSIS — R070 Pain in throat: Secondary | ICD-10-CM | POA: Diagnosis present

## 2017-12-10 LAB — RAPID STREP SCREEN (MED CTR MEBANE ONLY): STREPTOCOCCUS, GROUP A SCREEN (DIRECT): POSITIVE — AB

## 2017-12-10 MED ORDER — PENICILLIN G BENZATHINE & PROC 1200000 UNIT/2ML IM SUSP
1.2000 10*6.[IU] | Freq: Once | INTRAMUSCULAR | Status: AC
  Administered 2017-12-10: 1.2 10*6.[IU] via INTRAMUSCULAR
  Filled 2017-12-10: qty 2

## 2017-12-10 MED ORDER — DEXAMETHASONE SODIUM PHOSPHATE 10 MG/ML IJ SOLN
10.0000 mg | Freq: Once | INTRAMUSCULAR | Status: DC
  Filled 2017-12-10: qty 1

## 2017-12-10 MED ORDER — DEXAMETHASONE SODIUM PHOSPHATE 10 MG/ML IJ SOLN
10.0000 mg | Freq: Once | INTRAMUSCULAR | Status: AC
  Administered 2017-12-10: 10 mg via INTRAMUSCULAR

## 2017-12-10 MED ORDER — IBUPROFEN 800 MG PO TABS
800.0000 mg | ORAL_TABLET | Freq: Once | ORAL | Status: AC
  Administered 2017-12-10: 800 mg via ORAL
  Filled 2017-12-10: qty 1

## 2017-12-10 NOTE — ED Provider Notes (Signed)
[facility] EMERGENCY DEPARTMENT Provider Note   CSN: [id number] Arrival date & time: 12/10/17  1308     History   Chief Complaint Chief Complaint  Patient presents with  . Sore Throat    HPI Selena Boyd is a 31 y.o. female.  HPI   Patient is a 31 year old female presenting with sore throat.  Patient sore throat mild headache low-grade fever.  Patient's mother had strep.  She thinks she has strep.  Patient has trouble with pain with swallowing.  No trouble managing her secretions.  Past Medical History:  Diagnosis Date  . Anemia   . GERD (gastroesophageal reflux disease)   . High cholesterol     There are no active problems to display for this patient.   Past Surgical History:  Procedure Laterality Date  . CESAREAN SECTION    . LAPAROSCOPY      OB History    No data available       Home Medications    Prior to Admission medications   Medication Sig Start Date End Date Taking? Authorizing Provider  albuterol (PROVENTIL HFA;VENTOLIN HFA) 108 (90 Base) MCG/ACT inhaler Inhale into the lungs. 10/17/16 12/10/17 Yes [provider]  acetaminophen (TYLENOL) 500 MG tablet Take 1 tablet (500 mg total) by mouth every 6 (six) hours as needed. 02/06/16   Mady GemmaWestfall, Elizabeth C, PA-C  amLODipine (NORVASC) 10 MG tablet  04/17/17   [provider]  ferrous sulfate (SM IRON) 325 (65 FE) MG tablet Take 325 mg by mouth.    [provider]  ibuprofen (ADVIL,MOTRIN) 400 MG tablet Take 1 tablet (400 mg total) by mouth every 6 (six) hours as needed. Patient not taking: Reported on 05/29/2017 01/17/16   Joycie Peekartner, Benjamin, PA-C  ibuprofen (ADVIL,MOTRIN) 800 MG tablet Take 1 tablet (800 mg total) by mouth 3 (three) times daily. Patient not taking: Reported on 05/29/2017 02/06/16   Mady GemmaWestfall, Elizabeth C, PA-C  pravastatin (PRAVACHOL) 40 MG tablet Take 40 mg by mouth. 04/17/17 04/17/18  [provider]  Vitamin D, Ergocalciferol, (DRISDOL) 50000  units CAPS capsule Take by mouth. 04/17/17   [provider]    Family History No family history on file.  Social History Social History   Tobacco Use  . Smoking status: Never Smoker  . Smokeless tobacco: Never Used  Substance Use Topics  . Alcohol use: No  . Drug use: No     Allergies   Patient has no known allergies.   Review of Systems Review of Systems  Constitutional: Positive for fever. Negative for activity change.  HENT: Positive for sore throat. Negative for congestion.   Respiratory: Negative for shortness of breath.   Cardiovascular: Negative for chest pain.  Gastrointestinal: Negative for abdominal pain.  All other systems reviewed and are negative.    Physical Exam Updated Vital Signs BP (!) 146/83 (BP Location: Right Arm)   Pulse (!) 108   Temp 98.6 F (37 C) (Oral)   Resp 16   Ht 5\' 6"  (1.676 m)   Wt 129.3 kg (285 lb)   LMP 11/27/2017   SpO2 98%   BMI 46.00 kg/m   Physical Exam  Constitutional: She is oriented to person, place, and time. She appears well-developed and well-nourished.  HENT:  Head: Normocephalic and atraumatic.  Mouth/Throat: Mucous membranes are not dry. Posterior oropharyngeal erythema present. Tonsils are 1+ on the right. Tonsils are 1+ on the left.  Eyes: Pupils are equal, round, and reactive to light. Right eye exhibits  no discharge.  Neck: Normal range of motion.  Cardiovascular: Normal rate, regular rhythm and normal heart sounds.  No murmur heard. Pulmonary/Chest: Effort normal and breath sounds normal. She has no wheezes. She has no rales.  Abdominal: Soft. She exhibits no distension. There is no tenderness.  Neurological: She is oriented to person, place, and time.  Skin: Skin is warm and dry. She is not diaphoretic.  Psychiatric: She has a normal mood and affect.  Nursing note and vitals reviewed.    ED Treatments / Results  Labs (all labs ordered are listed, but only abnormal results are  displayed) Labs Reviewed  RAPID STREP SCREEN (NOT AT [facility]) - Abnormal; Notable for the following components:      Result Value   Streptococcus, Group A Screen (Direct) POSITIVE (*)    All other components within normal limits    EKG  EKG Interpretation None       Radiology No results found.  Procedures Procedures (including critical care time)  Medications Ordered in ED Medications  penicillin g procaine-penicillin g benzathine (BICILLIN-CR) injection 600000-600000 units (not administered)  dexamethasone (DECADRON) injection 10 mg (not administered)  ibuprofen (ADVIL,MOTRIN) tablet 800 mg (not administered)     Initial Impression / Assessment and Plan / ED Course  I have reviewed the triage vital signs and the nursing notes.  Pertinent labs & imaging results that were available during my care of the patient were reviewed by me and considered in my medical decision making (see chart for details).     Patient is a 31 year old female presenting with sore throat.  Patient sore throat mild headache low-grade fever.  Patient's mother had strep.  She thinks she has strep.  Patient has trouble with pain with swallowing.  No trouble managing her secretions.   5:09 PM Patient reports someone once told her that she had borderline sugars.  Told her that the Dex could increase her sugars. She would like to take anyway.  We will give her Dex, and 1 shot of antibiotics.  Return precautions expressed.  Final Clinical Impressions(s) / ED Diagnoses   Final diagnoses:  None    ED Discharge Orders    None       Abelino DerrickMackuen, Frida Wahlstrom Lyn, MD 12/10/17 1709

## 2017-12-10 NOTE — Discharge Instructions (Signed)
Please return if increasing pain with swallowing or not improving.

## 2017-12-10 NOTE — ED Triage Notes (Signed)
Sore throat for a week. Chills, fever.

## 2019-06-12 ENCOUNTER — Encounter (HOSPITAL_BASED_OUTPATIENT_CLINIC_OR_DEPARTMENT_OTHER): Payer: Self-pay

## 2019-06-12 ENCOUNTER — Emergency Department (HOSPITAL_BASED_OUTPATIENT_CLINIC_OR_DEPARTMENT_OTHER): Payer: Medicaid Other

## 2019-06-12 ENCOUNTER — Emergency Department (HOSPITAL_BASED_OUTPATIENT_CLINIC_OR_DEPARTMENT_OTHER)
Admission: EM | Admit: 2019-06-12 | Discharge: 2019-06-12 | Disposition: A | Discharge status: Home / Self Care | Payer: Medicaid Other | Attending: Emergency Medicine | Admitting: Emergency Medicine

## 2019-06-12 ENCOUNTER — Other Ambulatory Visit: Payer: Self-pay

## 2019-06-12 DIAGNOSIS — Z79899 Other long term (current) drug therapy: Secondary | ICD-10-CM | POA: Diagnosis not present

## 2019-06-12 DIAGNOSIS — M79604 Pain in right leg: Secondary | ICD-10-CM | POA: Diagnosis present

## 2019-06-12 MED ORDER — IBUPROFEN 400 MG PO TABS
600.0000 mg | ORAL_TABLET | Freq: Once | ORAL | Status: AC
  Administered 2019-06-12: 600 mg via ORAL
  Filled 2019-06-12: qty 1

## 2019-06-12 NOTE — ED Triage Notes (Signed)
C/o pain to right LE x 2 days-denies injury-NAD-steady gait

## 2019-06-12 NOTE — ED Provider Notes (Signed)
[facility] EMERGENCY DEPARTMENT Provider Note   CSN: [id number] Arrival date & time: 06/12/19  1739    History   Chief Complaint Chief Complaint  Patient presents with   Leg Pain    HPI Selena Boyd is a 33 y.o. female.     HPI  33 year old female presents with right lateral leg pain.  It is just proximal to her right knee.  Started a couple days ago.  Feels like a cramping in her leg.  Is pretty much constant though a little worse when she bends her knee.  No injuries.  No appreciable swelling or skin color changes.  No fevers or weakness/numbness.    Past Medical History:  Diagnosis Date   Anemia    GERD (gastroesophageal reflux disease)    High cholesterol     There are no active problems to display for this patient.   Past Surgical History:  Procedure Laterality Date   CESAREAN SECTION     LAPAROSCOPY       OB History   No obstetric history on file.      Home Medications    Prior to Admission medications   Medication Sig Start Date End Date Taking? Authorizing Provider  acetaminophen (TYLENOL) 500 MG tablet Take 1 tablet (500 mg total) by mouth every 6 (six) hours as needed. 02/06/16   Mady GemmaWestfall, Elizabeth C, PA-C  albuterol (PROVENTIL HFA;VENTOLIN HFA) 108 (90 Base) MCG/ACT inhaler Inhale into the lungs. 10/17/16 12/10/17  [provider]  amLODipine (NORVASC) 10 MG tablet  04/17/17   [provider]  ferrous sulfate (SM IRON) 325 (65 FE) MG tablet Take 325 mg by mouth.    [provider]  ibuprofen (ADVIL,MOTRIN) 400 MG tablet Take 1 tablet (400 mg total) by mouth every 6 (six) hours as needed. Patient not taking: Reported on 05/29/2017 01/17/16   Joycie Peekartner, Benjamin, PA-C  ibuprofen (ADVIL,MOTRIN) 800 MG tablet Take 1 tablet (800 mg total) by mouth 3 (three) times daily. Patient not taking: Reported on 05/29/2017 02/06/16   Mady GemmaWestfall, Elizabeth C, PA-C  pravastatin (PRAVACHOL) 40 MG tablet Take 40 mg by mouth. 04/17/17  04/17/18  [provider]  Vitamin D, Ergocalciferol, (DRISDOL) 50000 units CAPS capsule Take by mouth. 04/17/17   [provider]    Family History No family history on file.  Social History Social History   Tobacco Use   Smoking status: Never Smoker   Smokeless tobacco: Never Used  Substance Use Topics   Alcohol use: No   Drug use: No     Allergies   Patient has no known allergies.   Review of Systems Review of Systems  Respiratory: Negative for shortness of breath.   Cardiovascular: Negative for chest pain and leg swelling.  Musculoskeletal: Positive for myalgias.  Neurological: Negative for weakness and numbness.  All other systems reviewed and are negative.    Physical Exam Updated Vital Signs BP 112/89    Pulse 96    Temp 98.9 F (37.2 C) (Oral)    Resp 18    Ht 5\' 6"  (1.676 m)    Wt 136.1 kg    LMP 05/25/2019    SpO2 100%    BMI 48.42 kg/m   Physical Exam Vitals signs and nursing note reviewed.  Constitutional:      Appearance: She is well-developed. She is obese.  HENT:     Head: Normocephalic and atraumatic.     Right Ear: External ear normal.     Left Ear:  External ear normal.     Nose: Nose normal.  Eyes:     General:        Right eye: No discharge.        Left eye: No discharge.  Cardiovascular:     Rate and Rhythm: Normal rate and regular rhythm.     Pulses:          Dorsalis pedis pulses are 2+ on the right side and 2+ on the left side.     Heart sounds: Normal heart sounds.  Pulmonary:     Effort: Pulmonary effort is normal.     Breath sounds: Normal breath sounds.  Abdominal:     Palpations: Abdomen is soft.     Tenderness: There is no abdominal tenderness.  Musculoskeletal:     Right knee: She exhibits normal range of motion and no swelling. No tenderness found.     Right lower leg: She exhibits no tenderness and no swelling.       Legs:     Comments: Normal strength/sensation in RLE  Skin:    General: Skin is  warm and dry.  Neurological:     Mental Status: She is alert.  Psychiatric:        Mood and Affect: Mood is not anxious.      ED Treatments / Results  Labs (all labs ordered are listed, but only abnormal results are displayed) Labs Reviewed - No data to display  EKG None  Radiology [place] Venous Img Lower Unilateral Right  Result Date: 06/12/2019 CLINICAL DATA:  Acute right lower extremity pain without known injury. EXAM: Right LOWER EXTREMITY VENOUS DOPPLER ULTRASOUND TECHNIQUE: Gray-scale sonography with graded compression, as well as color Doppler and duplex ultrasound were performed to evaluate the lower extremity deep venous systems from the level of the common femoral vein and including the common femoral, femoral, profunda femoral, popliteal and calf veins including the posterior tibial, peroneal and gastrocnemius veins when visible. The superficial great saphenous vein was also interrogated. Spectral Doppler was utilized to evaluate flow at rest and with distal augmentation maneuvers in the common femoral, femoral and popliteal veins. COMPARISON:  None. FINDINGS: Contralateral Common Femoral Vein: Respiratory phasicity is normal and symmetric with the symptomatic side. No evidence of thrombus. Normal compressibility. Common Femoral Vein: No evidence of thrombus. Normal compressibility, respiratory phasicity and response to augmentation. Saphenofemoral Junction: No evidence of thrombus. Normal compressibility and flow on color Doppler imaging. Profunda Femoral Vein: No evidence of thrombus. Normal compressibility and flow on color Doppler imaging. Femoral Vein: No evidence of thrombus. Normal compressibility, respiratory phasicity and response to augmentation. Popliteal Vein: No evidence of thrombus. Normal compressibility, respiratory phasicity and response to augmentation. Calf Veins: No evidence of thrombus. Normal compressibility and flow on color Doppler imaging. Superficial Great  Saphenous Vein: No evidence of thrombus. Normal compressibility. Venous Reflux:  None. Other Findings:  None. IMPRESSION: No evidence of deep venous thrombosis seen in right lower extremity. Electronically Signed   By: Lupita RaiderJames  Green Jr M.D.   On: 06/12/2019 19:39   Dg Knee Complete 4 Views Right  Result Date: 06/12/2019 CLINICAL DATA:  Acute right knee pain without known injury. EXAM: RIGHT KNEE - COMPLETE 4+ VIEW COMPARISON:  None. FINDINGS: No evidence of fracture, dislocation, or joint effusion. No evidence of arthropathy or other focal bone abnormality. Soft tissues are unremarkable. IMPRESSION: Negative. Electronically Signed   By: Lupita RaiderJames  Green Jr M.D.   On: 06/12/2019 19:39    Procedures Procedures (including critical  care time)  Medications Ordered in ED Medications  ibuprofen (ADVIL) tablet 600 mg (600 mg Oral Given 06/12/19 1853)     Initial Impression / Assessment and Plan / ED Course  I have reviewed the triage vital signs and the nursing notes.  Pertinent labs & imaging results that were available during my care of the patient were reviewed by me and considered in my medical decision making (see chart for details).        Unclear cause of the patient's right leg pain.  X-ray is benign and ultrasound is negative.  Probably mild cramping in general rather than any type of deep space infection or other acute abnormality.  Neurovascular intact.  NSAIDs.  Final Clinical Impressions(s) / ED Diagnoses   Final diagnoses:  Right leg pain    ED Discharge Orders    None       Sherwood Gambler, MD 06/12/19 2334

## 2019-06-12 NOTE — ED Notes (Signed)
Pt understood dc material. NAD noted. All questions answered to satisfaction. Pt escorted to check out counter. 

## 2021-05-31 ENCOUNTER — Emergency Department (HOSPITAL_BASED_OUTPATIENT_CLINIC_OR_DEPARTMENT_OTHER): Payer: Medicaid Other

## 2021-05-31 ENCOUNTER — Other Ambulatory Visit: Payer: Self-pay

## 2021-05-31 ENCOUNTER — Emergency Department (HOSPITAL_BASED_OUTPATIENT_CLINIC_OR_DEPARTMENT_OTHER)
Admission: EM | Admit: 2021-05-31 | Discharge: 2021-05-31 | Disposition: A | Discharge status: Home / Self Care | Payer: Medicaid Other | Attending: Emergency Medicine | Admitting: Emergency Medicine

## 2021-05-31 ENCOUNTER — Encounter (HOSPITAL_BASED_OUTPATIENT_CLINIC_OR_DEPARTMENT_OTHER): Payer: Self-pay

## 2021-05-31 DIAGNOSIS — R197 Diarrhea, unspecified: Secondary | ICD-10-CM | POA: Diagnosis not present

## 2021-05-31 DIAGNOSIS — K219 Gastro-esophageal reflux disease without esophagitis: Secondary | ICD-10-CM | POA: Diagnosis not present

## 2021-05-31 DIAGNOSIS — E119 Type 2 diabetes mellitus without complications: Secondary | ICD-10-CM | POA: Insufficient documentation

## 2021-05-31 DIAGNOSIS — A599 Trichomoniasis, unspecified: Secondary | ICD-10-CM | POA: Insufficient documentation

## 2021-05-31 DIAGNOSIS — R079 Chest pain, unspecified: Secondary | ICD-10-CM

## 2021-05-31 DIAGNOSIS — R059 Cough, unspecified: Secondary | ICD-10-CM | POA: Diagnosis present

## 2021-05-31 DIAGNOSIS — J302 Other seasonal allergic rhinitis: Secondary | ICD-10-CM | POA: Diagnosis not present

## 2021-05-31 DIAGNOSIS — R1013 Epigastric pain: Secondary | ICD-10-CM

## 2021-05-31 HISTORY — DX: Type 2 diabetes mellitus without complications: E11.9

## 2021-05-31 LAB — CBC WITH DIFFERENTIAL/PLATELET
Abs Immature Granulocytes: 0.05 10*3/uL (ref 0.00–0.07)
Basophils Absolute: 0.1 10*3/uL (ref 0.0–0.1)
Basophils Relative: 1 %
Eosinophils Absolute: 0.4 10*3/uL (ref 0.0–0.5)
Eosinophils Relative: 4 %
HCT: 31.4 % — ABNORMAL LOW (ref 36.0–46.0)
Hemoglobin: 9.2 g/dL — ABNORMAL LOW (ref 12.0–15.0)
Immature Granulocytes: 1 %
Lymphocytes Relative: 38 %
Lymphs Abs: 3.1 10*3/uL (ref 0.7–4.0)
MCH: 20.4 pg — ABNORMAL LOW (ref 26.0–34.0)
MCHC: 29.3 g/dL — ABNORMAL LOW (ref 30.0–36.0)
MCV: 69.8 fL — ABNORMAL LOW (ref 80.0–100.0)
Monocytes Absolute: 0.7 10*3/uL (ref 0.1–1.0)
Monocytes Relative: 9 %
Neutro Abs: 3.9 10*3/uL (ref 1.7–7.7)
Neutrophils Relative %: 47 %
Platelets: 649 10*3/uL — ABNORMAL HIGH (ref 150–400)
RBC: 4.5 MIL/uL (ref 3.87–5.11)
RDW: 18.7 % — ABNORMAL HIGH (ref 11.5–15.5)
WBC: 8.2 10*3/uL (ref 4.0–10.5)
nRBC: 0 % (ref 0.0–0.2)

## 2021-05-31 LAB — URINALYSIS, ROUTINE W REFLEX MICROSCOPIC
Bilirubin Urine: NEGATIVE
Glucose, UA: NEGATIVE mg/dL
Ketones, ur: NEGATIVE mg/dL
Nitrite: NEGATIVE
Protein, ur: NEGATIVE mg/dL
Specific Gravity, Urine: 1.025 (ref 1.005–1.030)
pH: 5.5 (ref 5.0–8.0)

## 2021-05-31 LAB — D-DIMER, QUANTITATIVE: D-Dimer, Quant: <0.27 ug/mL-FEU (ref 0.00–0.50)

## 2021-05-31 LAB — PREGNANCY, URINE: Preg Test, Ur: NEGATIVE

## 2021-05-31 LAB — COMPREHENSIVE METABOLIC PANEL
ALT: 13 U/L (ref 0–44)
AST: 14 U/L — ABNORMAL LOW (ref 15–41)
Albumin: 3.8 g/dL (ref 3.5–5.0)
Alkaline Phosphatase: 51 U/L (ref 38–126)
Anion gap: 8 (ref 5–15)
BUN: 12 mg/dL (ref 6–20)
CO2: 21 mmol/L — ABNORMAL LOW (ref 22–32)
Calcium: 9.3 mg/dL (ref 8.9–10.3)
Chloride: 107 mmol/L (ref 98–111)
Creatinine, Ser: 0.83 mg/dL (ref 0.44–1.00)
GFR, Estimated: >60 mL/min (ref >60)
Glucose, Bld: 105 mg/dL — ABNORMAL HIGH (ref 70–99)
Potassium: 4 mmol/L (ref 3.5–5.1)
Sodium: 136 mmol/L (ref 135–145)
Total Bilirubin: 0.2 mg/dL — ABNORMAL LOW (ref 0.3–1.2)
Total Protein: 8.3 g/dL — ABNORMAL HIGH (ref 6.5–8.1)

## 2021-05-31 LAB — URINALYSIS, MICROSCOPIC (REFLEX): RBC / HPF: >50 RBC/hpf (ref 0–5)

## 2021-05-31 LAB — TROPONIN I (HIGH SENSITIVITY): Troponin I (High Sensitivity): <2 ng/L (ref <18)

## 2021-05-31 LAB — LIPASE, BLOOD: Lipase: 33 U/L (ref 11–51)

## 2021-05-31 MED ORDER — MORPHINE SULFATE (PF) 4 MG/ML IV SOLN
4.0000 mg | Freq: Once | INTRAVENOUS | Status: DC
  Filled 2021-05-31: qty 1

## 2021-05-31 MED ORDER — SUCRALFATE 1 G PO TABS: 1.0000 g | ORAL_TABLET | Freq: Three times a day (TID) | ORAL | 0 refills | Status: AC

## 2021-05-31 MED ORDER — BENZONATATE 100 MG PO CAPS: 100.0000 mg | ORAL_CAPSULE | Freq: Three times a day (TID) | ORAL | 0 refills | Status: AC

## 2021-05-31 MED ORDER — METRONIDAZOLE 500 MG PO TABS
2000.0000 mg | ORAL_TABLET | Freq: Once | ORAL | Status: AC
  Administered 2021-05-31: 2000 mg via ORAL
  Filled 2021-05-31: qty 4

## 2021-05-31 MED ORDER — FLUTICASONE PROPIONATE 50 MCG/ACT NA SUSP: 2.0000 | Freq: Every day | NASAL | 2 refills | Status: AC

## 2021-05-31 MED ORDER — PANTOPRAZOLE SODIUM 40 MG IV SOLR
40.0000 mg | Freq: Once | INTRAVENOUS | Status: AC
  Administered 2021-05-31: 40 mg via INTRAVENOUS
  Filled 2021-05-31: qty 40

## 2021-05-31 MED ORDER — SODIUM CHLORIDE 0.9 % IV BOLUS
1000.0000 mL | Freq: Once | INTRAVENOUS | Status: AC
  Administered 2021-05-31: 1000 mL via INTRAVENOUS

## 2021-05-31 MED ORDER — ONDANSETRON HCL 4 MG/2ML IJ SOLN
4.0000 mg | Freq: Once | INTRAMUSCULAR | Status: AC
  Administered 2021-05-31: 4 mg via INTRAVENOUS
  Filled 2021-05-31: qty 2

## 2021-05-31 NOTE — Discharge Instructions (Addendum)
Start taking medications as prescribed, Flonase for runny nose, Carafate for epigastric pain as well as chest burning as well as Tessalon Perles for cough

## 2021-05-31 NOTE — ED Notes (Signed)
IV attempt x 1 unsuccessful. ?

## 2021-05-31 NOTE — ED Notes (Signed)
Dry hacking cough x1 month  Pain in her lungs sometimes at night; worse when she takes a deep breath.

## 2021-05-31 NOTE — ED Triage Notes (Signed)
Pt c/o cough, CP x 1 month-states she was seen by PCP and completed abx for "mucus and cough"-also c/o abd pain x 1week-NAD-steady gait

## 2021-05-31 NOTE — ED Provider Notes (Signed)
[facility] EMERGENCY DEPARTMENT Provider Note   CSN: [id number] Arrival date & time: 05/31/21  1124     History Chief Complaint  Patient presents with   Cough    Selena Boyd is a 35 y.o. female with past medical history significant for anemia, diabetes, GERD, hypercholesterolemia who presents for evaluation of multiple complaints.  Patient with dry cough over the last month.  She feels like she has had some rhinorrhea and congestion as well which is clear in nature.  Was started on allergy medication by PCP.  She feels like when she lays flat at night her mucus drips into the back of her throat which causes her to cough.  States she occasionally has pain in her lungs.  Worse when she moves, takes a deep breath.  Pain is not necessarily exertional in nature.  No leg swelling, redness or warmth.  No history of PE or DVT.  States she was placed on antibiotics by PCP which completed this. Also recently started on PPI. Patient also states she has had epigastric pain as well as some intermittent loose stools.  No emesis.  No melena or bright blood per rectum.  Pain does not radiate into back.  No pain to right upper quadrant, lower abdomen.  No recent travel.  Denies additional aggravating or alleviating factors. Rates pain a 7/10.  History obtained from patient and past medical records.  No interpreter used  HPI     Past Medical History:  Diagnosis Date   Anemia    Diabetes mellitus without complication (HCC)    GERD (gastroesophageal reflux disease)    High cholesterol     There are no problems to display for this patient.   Past Surgical History:  Procedure Laterality Date   CESAREAN SECTION     LAPAROSCOPY       OB History   No obstetric history on file.     No family history on file.  Social History   Tobacco Use   Smoking status: Never   Smokeless tobacco: Never  Vaping Use   Vaping Use: Never used  Substance Use Topics   Alcohol use: No   Drug  use: No    Home Medications Prior to Admission medications   Medication Sig Start Date End Date Taking? Authorizing Provider  benzonatate (TESSALON) 100 MG capsule Take 1 capsule (100 mg total) by mouth every 8 (eight) hours. 05/31/21  Yes Daysha Ashmore A, PA-C  fluticasone (FLONASE) 50 MCG/ACT nasal spray Place 2 sprays into both nostrils daily. 05/31/21  Yes Shawnique Mariotti A, PA-C  sucralfate (CARAFATE) 1 g tablet Take 1 tablet (1 g total) by mouth 4 (four) times daily -  with meals and at bedtime for 14 days. 05/31/21 06/14/21 Yes Cherri Yera A, PA-C  acetaminophen (TYLENOL) 500 MG tablet Take 1 tablet (500 mg total) by mouth every 6 (six) hours as needed. 02/06/16   Mady Gemma, PA-C  albuterol (PROVENTIL HFA;VENTOLIN HFA) 108 (90 Base) MCG/ACT inhaler Inhale into the lungs. 10/17/16 12/10/17  [provider]  amLODipine (NORVASC) 10 MG tablet  04/17/17   [provider]  ferrous sulfate (SM IRON) 325 (65 FE) MG tablet Take 325 mg by mouth.    [provider]  ibuprofen (ADVIL,MOTRIN) 400 MG tablet Take 1 tablet (400 mg total) by mouth every 6 (six) hours as needed. Patient not taking: Reported on 05/29/2017 01/17/16   Joycie Peek, PA-C  ibuprofen (ADVIL,MOTRIN) 800 MG tablet Take 1 tablet (800  mg total) by mouth 3 (three) times daily. Patient not taking: Reported on 05/29/2017 02/06/16   Mady Gemma, PA-C  pravastatin (PRAVACHOL) 40 MG tablet Take 40 mg by mouth. 04/17/17 04/17/18  [provider]  Vitamin D, Ergocalciferol, (DRISDOL) 50000 units CAPS capsule Take by mouth. 04/17/17   [provider]    Allergies    Patient has no known allergies.  Review of Systems   Review of Systems  Constitutional: Negative.   HENT:  Positive for congestion, postnasal drip and rhinorrhea. Negative for sinus pressure, sneezing, sore throat, trouble swallowing and voice change.   Respiratory:  Positive for cough. Negative for apnea,  choking, chest tightness, shortness of breath, wheezing and stridor.   Cardiovascular:  Positive for chest pain. Negative for palpitations and leg swelling.  Gastrointestinal:  Positive for abdominal pain and diarrhea. Negative for abdominal distention, anal bleeding, blood in stool, constipation, nausea, rectal pain and vomiting.  Genitourinary: Negative.   Musculoskeletal: Negative.   Skin: Negative.   Neurological: Negative.   All other systems reviewed and are negative.  Physical Exam Updated Vital Signs BP (!) 145/97   Pulse 73   Temp 98.4 F (36.9 C) (Oral)   Resp 20   Ht 5\' 6"  (1.676 m)   Wt 136.1 kg   LMP 05/28/2021   SpO2 100%   BMI 48.42 kg/m   Physical Exam Vitals and nursing note reviewed.  Constitutional:      General: She is not in acute distress.    Appearance: She is well-developed. She is obese. She is not ill-appearing, toxic-appearing or diaphoretic.  HENT:     Head: Normocephalic and atraumatic.     Nose: Nose normal.     Mouth/Throat:     Mouth: Mucous membranes are moist.  Eyes:     Pupils: Pupils are equal, round, and reactive to light.  Cardiovascular:     Rate and Rhythm: Normal rate and regular rhythm.     Pulses:          Radial pulses are 2+ on the right side and 2+ on the left side.       Dorsalis pedis pulses are 2+ on the right side and 2+ on the left side.     Heart sounds: Normal heart sounds.  Pulmonary:     Effort: Pulmonary effort is normal. No respiratory distress.     Breath sounds: Normal breath sounds and air entry.     Comments: Clear to auscultation bilaterally.  Speaks in full sentences without difficulty Chest:     Comments: Equal rise and fall to chest wall.  Nontender Abdominal:     General: Bowel sounds are normal. There is no distension.     Palpations: Abdomen is soft.     Tenderness: There is generalized abdominal tenderness and tenderness in the epigastric area.     Hernia: No hernia is present.     Comments: Soft,  generalized tenderness to epigastric region.  Musculoskeletal:        General: Normal range of motion.     Cervical back: Normal range of motion and neck supple.     Comments: No bony tenderness.  Compartments soft.  Moves all 4 extremities at difficulty.  Skin:    General: Skin is warm and dry.     Capillary Refill: Capillary refill takes less than 2 seconds.     Comments: No rashes or lesions.  Neurological:     General: No focal deficit present.  Mental Status: She is alert and oriented to person, place, and time.     Cranial Nerves: Cranial nerves are intact.     Sensory: Sensation is intact.     Motor: Motor function is intact.     Gait: Gait is intact.    ED Results / Procedures / Treatments   Labs (all labs ordered are listed, but only abnormal results are displayed) Labs Reviewed  CBC WITH DIFFERENTIAL/PLATELET - Abnormal; Notable for the following components:      Result Value   Hemoglobin 9.2 (*)    HCT 31.4 (*)    MCV 69.8 (*)    MCH 20.4 (*)    MCHC 29.3 (*)    RDW 18.7 (*)    Platelets 649 (*)    All other components within normal limits  COMPREHENSIVE METABOLIC PANEL - Abnormal; Notable for the following components:   CO2 21 (*)    Glucose, Bld 105 (*)    Total Protein 8.3 (*)    AST 14 (*)    Total Bilirubin 0.2 (*)    All other components within normal limits  URINALYSIS, ROUTINE W REFLEX MICROSCOPIC - Abnormal; Notable for the following components:   APPearance CLOUDY (*)    Hgb urine dipstick LARGE (*)    Leukocytes,Ua TRACE (*)    All other components within normal limits  URINALYSIS, MICROSCOPIC (REFLEX) - Abnormal; Notable for the following components:   Bacteria, UA MANY (*)    Trichomonas, UA PRESENT (*)    All other components within normal limits  LIPASE, BLOOD  D-DIMER, QUANTITATIVE  PREGNANCY, URINE  TROPONIN I (HIGH SENSITIVITY)  TROPONIN I (HIGH SENSITIVITY)    EKG None  Radiology DG Chest Port 1 View  Result Date:  05/31/2021 CLINICAL DATA:  Chest pain for 1 month. EXAM: PORTABLE CHEST 1 VIEW COMPARISON:  05/15/2017 FINDINGS: The cardiac silhouette, mediastinal and hilar contours are within normal limits. The lungs are clear. No pleural effusion. No pulmonary lesions. The bony thorax is intact. IMPRESSION: No acute cardiopulmonary findings. Electronically Signed   By: Rudie Meyer M.D.   On: 05/31/2021 13:06    Procedures Procedures   Medications Ordered in ED Medications  morphine 4 MG/ML injection 4 mg (has no administration in time range)  metroNIDAZOLE (FLAGYL) tablet 2,000 mg (has no administration in time range)  pantoprazole (PROTONIX) injection 40 mg (40 mg Intravenous Given 05/31/21 1410)  ondansetron (ZOFRAN) injection 4 mg (4 mg Intravenous Given 05/31/21 1410)  sodium chloride 0.9 % bolus 1,000 mL (1,000 mLs Intravenous New Bag/Given 05/31/21 1403)    ED Course  I have reviewed the triage vital signs and the nursing notes.  Pertinent labs & imaging results that were available during my care of the patient were reviewed by me and considered in my medical decision making (see chart for details).  Here for evaluation of multiple complaints.  Afebrile, nonseptic, non-ill-appearing.  Dry cough, intermittent right-sided chest pain x 45month.  No clinical evidence of DVT on exam.  No immobilization, history of PE, DVT, malignancy.  Patient also some epigastric abdominal pain.  Associated congestion and rhinorrhea. Feels like she has some burning sensation from her abdomen that goes into her chest.  Heart and lungs clear.  Abdomen soft.  No focal abdominal tenderness on exam.  No back pain.  Plan on labs, imaging and reassess  Labs and imaging personally reviewed and interpreted:  CBC without leukocytosis, hemoglobin 9.2 CMP with glucose at 105, CO2 21 no other significant  abnormalities Troponin less than 2 Lipase 33 D-dimer <0.27 DG chest without acute abnormality EKG without ischemic  changes  Patient work-up reassuring.  Patient symptoms likely multifactorial from seasonal allergies as well as reflux related.  I have low suspicion for acute bacterial infectious process, intrathoracic, intra-abdominal process which would require surgical intervention or admission.  Will DC home with symptomatic management.  Close follow-up with PCP.  She will return for any new or worsening symptoms. Tolerating PO intake without difficulty. Will add Carafate as well as Flonase for rhinorrhea.  Flagyl given in the ED for trichomonas.  No alcohol use  The patient has been appropriately medically screened and/or stabilized in the ED. I have low suspicion for any other emergent medical condition which would require further screening, evaluation or treatment in the ED or require inpatient management.  Patient is hemodynamically stable and in no acute distress.  Patient able to ambulate in department prior to ED.  Evaluation does not show acute pathology that would require ongoing or additional emergent interventions while in the emergency department or further inpatient treatment.  I have discussed the diagnosis with the patient and answered all questions.  Pain is been managed while in the emergency department and patient has no further complaints prior to discharge.  Patient is comfortable with plan discussed in room and is stable for discharge at this time.  I have discussed strict return precautions for returning to the emergency department.  Patient was encouraged to follow-up with PCP/specialist refer to at discharge.      MDM Rules/Calculators/A&P                           Final Clinical Impression(s) / ED Diagnoses Final diagnoses:  Cough  Epigastric pain  Gastroesophageal reflux disease, unspecified whether esophagitis present  Seasonal allergies  Trichomonas infection    Rx / DC Orders ED Discharge Orders          Ordered    fluticasone (FLONASE) 50 MCG/ACT nasal spray  Daily         05/31/21 1552    sucralfate (CARAFATE) 1 g tablet  3 times daily with meals & bedtime        05/31/21 1552    benzonatate (TESSALON) 100 MG capsule  Every 8 hours        05/31/21 1552             Hilery Wintle A, PA-C 05/31/21 1554    Tegeler, Canary Brimhristopher J, MD 05/31/21 1559

## 2021-09-25 ENCOUNTER — Encounter (HOSPITAL_BASED_OUTPATIENT_CLINIC_OR_DEPARTMENT_OTHER): Payer: Self-pay

## 2021-09-25 ENCOUNTER — Emergency Department (HOSPITAL_BASED_OUTPATIENT_CLINIC_OR_DEPARTMENT_OTHER)
Admission: EM | Admit: 2021-09-25 | Discharge: 2021-09-25 | Disposition: A | Discharge status: Home / Self Care | Payer: Medicaid Other | Attending: Emergency Medicine | Admitting: Emergency Medicine

## 2021-09-25 ENCOUNTER — Emergency Department (HOSPITAL_BASED_OUTPATIENT_CLINIC_OR_DEPARTMENT_OTHER): Payer: Medicaid Other

## 2021-09-25 ENCOUNTER — Other Ambulatory Visit: Payer: Self-pay

## 2021-09-25 DIAGNOSIS — R0789 Other chest pain: Secondary | ICD-10-CM | POA: Insufficient documentation

## 2021-09-25 DIAGNOSIS — Z7951 Long term (current) use of inhaled steroids: Secondary | ICD-10-CM | POA: Diagnosis not present

## 2021-09-25 DIAGNOSIS — E119 Type 2 diabetes mellitus without complications: Secondary | ICD-10-CM | POA: Diagnosis not present

## 2021-09-25 DIAGNOSIS — R059 Cough, unspecified: Secondary | ICD-10-CM | POA: Insufficient documentation

## 2021-09-25 DIAGNOSIS — R0602 Shortness of breath: Secondary | ICD-10-CM | POA: Diagnosis not present

## 2021-09-25 DIAGNOSIS — J45909 Unspecified asthma, uncomplicated: Secondary | ICD-10-CM | POA: Diagnosis not present

## 2021-09-25 DIAGNOSIS — Z20822 Contact with and (suspected) exposure to covid-19: Secondary | ICD-10-CM | POA: Diagnosis not present

## 2021-09-25 LAB — RESP PANEL BY RT-PCR (FLU A&B, COVID) ARPGX2
Influenza A by PCR: NEGATIVE
Influenza B by PCR: NEGATIVE
SARS Coronavirus 2 by RT PCR: NEGATIVE

## 2021-09-25 MED ORDER — DEXAMETHASONE 4 MG PO TABS
6.0000 mg | ORAL_TABLET | Freq: Once | ORAL | Status: AC
  Administered 2021-09-25: 6 mg via ORAL
  Filled 2021-09-25: qty 2

## 2021-09-25 MED ORDER — ALBUTEROL SULFATE HFA 108 (90 BASE) MCG/ACT IN AERS
6.0000 | INHALATION_SPRAY | Freq: Once | RESPIRATORY_TRACT | Status: AC
  Administered 2021-09-25: 6 via RESPIRATORY_TRACT
  Filled 2021-09-25: qty 6.7

## 2021-09-25 NOTE — ED Triage Notes (Signed)
Pt c/o cough since yesterday. Hx asthma. Denies SOB

## 2021-09-25 NOTE — ED Provider Notes (Signed)
[facility] EMERGENCY DEPARTMENT Provider Note   CSN: [id number] Arrival date & time: 09/25/21  1442     History Chief Complaint  Patient presents with   Cough    Selena Boyd is a 35 y.o. female.   Cough Associated symptoms: shortness of breath   Associated symptoms: no chest pain, no chills, no ear pain, no fever, no headaches, no myalgias, no rash, no rhinorrhea and no sore throat   Patient presents for cough and chest tightness.  Symptoms have been present over the past 24 hours.  She has been using breathing treatments at home with slight relief.  She does have a history of asthma and states that her current symptoms are consistent with previous asthma exacerbations.  Other than the cough, she has not had any recent URI symptoms.  She is not aware of any common triggers of her asthma.  She has not had any vomiting or difficulty with p.o. intake.  She has not had any fevers or chills.    Past Medical History:  Diagnosis Date   Anemia    Diabetes mellitus without complication (HCC)    GERD (gastroesophageal reflux disease)    High cholesterol     There are no problems to display for this patient.   Past Surgical History:  Procedure Laterality Date   CESAREAN SECTION     LAPAROSCOPY       OB History   No obstetric history on file.     History reviewed. No pertinent family history.  Social History   Tobacco Use   Smoking status: Never   Smokeless tobacco: Never  Vaping Use   Vaping Use: Never used  Substance Use Topics   Alcohol use: No   Drug use: No    Home Medications Prior to Admission medications   Medication Sig Start Date End Date Taking? Authorizing Provider  acetaminophen (TYLENOL) 500 MG tablet Take 1 tablet (500 mg total) by mouth every 6 (six) hours as needed. 02/06/16   Mady Gemma, PA-C  albuterol (PROVENTIL HFA;VENTOLIN HFA) 108 (90 Base) MCG/ACT inhaler Inhale into the lungs. 10/17/16 12/10/17  [provider]  amLODipine (NORVASC) 10 MG tablet  04/17/17   [provider]  benzonatate (TESSALON) 100 MG capsule Take 1 capsule (100 mg total) by mouth every 8 (eight) hours. 05/31/21   Henderly, Britni A, PA-C  ferrous sulfate (SM IRON) 325 (65 FE) MG tablet Take 325 mg by mouth.    [provider]  fluticasone (FLONASE) 50 MCG/ACT nasal spray Place 2 sprays into both nostrils daily. 05/31/21   Henderly, Britni A, PA-C  ibuprofen (ADVIL,MOTRIN) 400 MG tablet Take 1 tablet (400 mg total) by mouth every 6 (six) hours as needed. Patient not taking: Reported on 05/29/2017 01/17/16   Joycie Peek, PA-C  ibuprofen (ADVIL,MOTRIN) 800 MG tablet Take 1 tablet (800 mg total) by mouth 3 (three) times daily. Patient not taking: Reported on 05/29/2017 02/06/16   Mady Gemma, PA-C  pravastatin (PRAVACHOL) 40 MG tablet Take 40 mg by mouth. 04/17/17 04/17/18  [provider]  sucralfate (CARAFATE) 1 g tablet Take 1 tablet (1 g total) by mouth 4 (four) times daily -  with meals and at bedtime for 14 days. 05/31/21 06/14/21  Henderly, Britni A, PA-C  Vitamin D, Ergocalciferol, (DRISDOL) 50000 units CAPS capsule Take by mouth. 04/17/17   [provider]    Allergies    Patient has no known allergies.  Review of Systems  Review of Systems  Constitutional:  Negative for activity change, appetite change, chills, fatigue and fever.  HENT:  Negative for congestion, ear pain, rhinorrhea and sore throat.   Eyes:  Negative for pain and visual disturbance.  Respiratory:  Positive for cough, chest tightness and shortness of breath. Negative for stridor.   Cardiovascular:  Negative for chest pain and palpitations.  Gastrointestinal:  Negative for abdominal pain, diarrhea, nausea and vomiting.  Genitourinary:  Negative for dysuria, flank pain, hematuria and pelvic pain.  Musculoskeletal:  Negative for arthralgias, back pain, myalgias and neck pain.  Skin:  Negative for color change and rash.   Neurological:  Negative for dizziness, seizures, syncope, weakness, light-headedness, numbness and headaches.  All other systems reviewed and are negative.  Physical Exam Updated Vital Signs BP (!) 155/99 (BP Location: Left Wrist)   Pulse 86   Temp 98.3 F (36.8 C) (Oral)   Resp 16   Ht 5\' 6"  (1.676 m)   Wt (!) 137.4 kg   LMP 09/15/2021 (Approximate)   SpO2 100%   BMI 48.91 kg/m   Physical Exam Vitals and nursing note reviewed.  Constitutional:      General: She is not in acute distress.    Appearance: Normal appearance. She is well-developed. She is not ill-appearing, toxic-appearing or diaphoretic.  HENT:     Head: Normocephalic and atraumatic.     Right Ear: External ear normal.     Left Ear: External ear normal.     Nose: Nose normal. No congestion.     Mouth/Throat:     Mouth: Mucous membranes are moist.     Pharynx: Oropharynx is clear.  Eyes:     General: No scleral icterus.    Extraocular Movements: Extraocular movements intact.     Conjunctiva/sclera: Conjunctivae normal.  Cardiovascular:     Rate and Rhythm: Normal rate and regular rhythm.     Heart sounds: No murmur heard. Pulmonary:     Effort: Pulmonary effort is normal. No respiratory distress.     Comments: Distant breath sounds due to habitus.  Breathing appears unlabored.  She is able to speak in complete sentences.  SPO2 is normal on room air. Chest:     Chest wall: No tenderness.  Abdominal:     Palpations: Abdomen is soft.     Tenderness: There is no abdominal tenderness. There is no right CVA tenderness or left CVA tenderness.  Musculoskeletal:        General: Normal range of motion.     Cervical back: Normal range of motion and neck supple.     Right lower leg: No edema.     Left lower leg: No edema.  Skin:    General: Skin is warm and dry.     Coloration: Skin is not jaundiced or pale.  Neurological:     General: No focal deficit present.     Mental Status: She is alert and oriented to  person, place, and time.     Cranial Nerves: No cranial nerve deficit.     Sensory: No sensory deficit.     Motor: No weakness.  Psychiatric:        Mood and Affect: Mood normal.        Behavior: Behavior normal.    ED Results / Procedures / Treatments   Labs (all labs ordered are listed, but only abnormal results are displayed) Labs Reviewed  RESP PANEL BY RT-PCR (FLU A&B, COVID) [id number]    EKG None  Radiology DG  Chest Portable 1 View  Result Date: 09/25/2021 CLINICAL DATA:  Pt c/o cough since yesterday. Hx asthma. EXAM: PORTABLE CHEST 1 VIEW COMPARISON:  Chest radiograph 05/31/2021 FINDINGS: Stable cardiomediastinal contours. The lungs are clear. No pneumothorax or significant pleural effusion. No acute finding in the visualized skeleton. IMPRESSION: No acute cardiopulmonary finding. Electronically Signed   By: Emmaline Kluver M.D.   On: 09/25/2021 16:29    Procedures Procedures   Medications Ordered in ED Medications  albuterol (VENTOLIN HFA) 108 (90 Base) MCG/ACT inhaler 6 puff (6 puffs Inhalation Given 09/25/21 1656)  dexamethasone (DECADRON) tablet 6 mg (6 mg Oral Given 09/25/21 1651)    ED Course  I have reviewed the triage vital signs and the nursing notes.  Pertinent labs & imaging results that were available during my care of the patient were reviewed by me and considered in my medical decision making (see chart for details).    MDM Rules/Calculators/A&P                          Patient is a 35 year old female with history of asthma, presenting for 24 hours of chest tightness and cough.  On arrival in the ED, vital signs notable for hypertension.  SPO2 is normal on room air.  She is able to speak in complete sentences and her breathing appears unlabored.  On lung auscultation, patient has distant breath sounds given her habitus.  She does describe her recent symptoms as consistent with asthma exacerbation.  Given her history, Decadron and albuterol were given in  the ED.  Chest x-ray showed no evidence of pulmonary edema or focal opacities.  On reassessment, patient reported improved symptoms.  She was advised to continue albuterol at home as needed and to return to the ED for any worsening in her work of breathing.  She was discharged in good condition.  Final Clinical Impression(s) / ED Diagnoses Final diagnoses:  Cough, unspecified type    Rx / DC Orders ED Discharge Orders     None        Gloris Manchester, MD 09/27/21 1348

## 2022-11-01 ENCOUNTER — Encounter (HOSPITAL_BASED_OUTPATIENT_CLINIC_OR_DEPARTMENT_OTHER): Payer: Self-pay | Admitting: Emergency Medicine

## 2022-11-01 ENCOUNTER — Other Ambulatory Visit: Payer: Self-pay

## 2022-11-01 ENCOUNTER — Emergency Department (HOSPITAL_BASED_OUTPATIENT_CLINIC_OR_DEPARTMENT_OTHER): Payer: Medicaid Other

## 2022-11-01 ENCOUNTER — Emergency Department (HOSPITAL_BASED_OUTPATIENT_CLINIC_OR_DEPARTMENT_OTHER)
Admission: EM | Admit: 2022-11-01 | Discharge: 2022-11-01 | Disposition: A | Discharge status: Home / Self Care | Payer: Medicaid Other | Attending: Emergency Medicine | Admitting: Emergency Medicine

## 2022-11-01 DIAGNOSIS — N12 Tubulo-interstitial nephritis, not specified as acute or chronic: Secondary | ICD-10-CM | POA: Diagnosis not present

## 2022-11-01 DIAGNOSIS — D649 Anemia, unspecified: Secondary | ICD-10-CM | POA: Diagnosis not present

## 2022-11-01 DIAGNOSIS — A599 Trichomoniasis, unspecified: Secondary | ICD-10-CM | POA: Diagnosis not present

## 2022-11-01 DIAGNOSIS — D72829 Elevated white blood cell count, unspecified: Secondary | ICD-10-CM | POA: Insufficient documentation

## 2022-11-01 DIAGNOSIS — R109 Unspecified abdominal pain: Secondary | ICD-10-CM | POA: Diagnosis present

## 2022-11-01 LAB — COMPREHENSIVE METABOLIC PANEL
ALT: 10 U/L (ref 0–44)
AST: 14 U/L — ABNORMAL LOW (ref 15–41)
Albumin: 3.2 g/dL — ABNORMAL LOW (ref 3.5–5.0)
Alkaline Phosphatase: 45 U/L (ref 38–126)
Anion gap: 7 (ref 5–15)
BUN: 9 mg/dL (ref 6–20)
CO2: 23 mmol/L (ref 22–32)
Calcium: 8.2 mg/dL — ABNORMAL LOW (ref 8.9–10.3)
Chloride: 106 mmol/L (ref 98–111)
Creatinine, Ser: 1.04 mg/dL — ABNORMAL HIGH (ref 0.44–1.00)
GFR, Estimated: >60 mL/min (ref >60)
Glucose, Bld: 101 mg/dL — ABNORMAL HIGH (ref 70–99)
Potassium: 3.5 mmol/L (ref 3.5–5.1)
Sodium: 136 mmol/L (ref 135–145)
Total Bilirubin: 0.6 mg/dL (ref 0.3–1.2)
Total Protein: 7.2 g/dL (ref 6.5–8.1)

## 2022-11-01 LAB — CBC
HCT: 21.9 % — ABNORMAL LOW (ref 36.0–46.0)
Hemoglobin: 6 g/dL — CL (ref 12.0–15.0)
MCH: 17.3 pg — ABNORMAL LOW (ref 26.0–34.0)
MCHC: 27.4 g/dL — ABNORMAL LOW (ref 30.0–36.0)
MCV: 63.3 fL — ABNORMAL LOW (ref 80.0–100.0)
Platelets: 614 10*3/uL — ABNORMAL HIGH (ref 150–400)
RBC: 3.46 MIL/uL — ABNORMAL LOW (ref 3.87–5.11)
RDW: 19.9 % — ABNORMAL HIGH (ref 11.5–15.5)
WBC: 13.6 10*3/uL — ABNORMAL HIGH (ref 4.0–10.5)
nRBC: 0.1 % (ref 0.0–0.2)

## 2022-11-01 LAB — URINALYSIS, MICROSCOPIC (REFLEX)

## 2022-11-01 LAB — URINALYSIS, ROUTINE W REFLEX MICROSCOPIC
Bilirubin Urine: NEGATIVE
Glucose, UA: NEGATIVE mg/dL
Ketones, ur: NEGATIVE mg/dL
Nitrite: NEGATIVE
Protein, ur: 100 mg/dL — AB
Specific Gravity, Urine: 1.025 (ref 1.005–1.030)
pH: 5.5 (ref 5.0–8.0)

## 2022-11-01 LAB — PREGNANCY, URINE: Preg Test, Ur: NEGATIVE

## 2022-11-01 LAB — LIPASE, BLOOD: Lipase: 27 U/L (ref 11–51)

## 2022-11-01 MED ORDER — IOHEXOL 300 MG/ML  SOLN
100.0000 mL | Freq: Once | INTRAMUSCULAR | Status: AC | PRN
  Administered 2022-11-01: 100 mL via INTRAVENOUS

## 2022-11-01 MED ORDER — METRONIDAZOLE 500 MG PO TABS: 500.0000 mg | ORAL_TABLET | Freq: Two times a day (BID) | ORAL | 0 refills | Status: AC

## 2022-11-01 MED ORDER — FERROUS SULFATE 325 (65 FE) MG PO TABS: 325.0000 mg | ORAL_TABLET | Freq: Every day | ORAL | 0 refills | Status: AC

## 2022-11-01 MED ORDER — CEPHALEXIN 500 MG PO CAPS: 500.0000 mg | ORAL_CAPSULE | Freq: Four times a day (QID) | ORAL | 0 refills | Status: AC

## 2022-11-01 MED ORDER — CEPHALEXIN 250 MG PO CAPS
500.0000 mg | ORAL_CAPSULE | Freq: Once | ORAL | Status: AC
  Administered 2022-11-01: 500 mg via ORAL
  Filled 2022-11-01: qty 2

## 2022-11-01 NOTE — Discharge Instructions (Addendum)
Your imaging today showed pyelonephritis which is an infection in your kidney likely due to urinary tract infection.  We have started you on antibiotics, Keflex.  Take as prescribed.  Your urine also showed trichomonas which is an STD.  We have started you on antibiotics for this.  Make sure to not drink alcohol while taking this medication for 48 hours after completion.  Please let your partners know so they may be treated as well  As discussed in the room you have anemia which is low blood count.  We recommended being transferred for blood transfusion which she declined.  I have started you on iron.  Please return if you change your mind by blood transfusion to [facility] or [facility]

## 2022-11-01 NOTE — ED Provider Notes (Signed)
[facility] EMERGENCY DEPARTMENT Provider Note   CSN: [id number] Arrival date & time: 11/01/22  1540     [id number]  Chief Complaint  Patient presents with   Flank Pain    Selena SchwabStephanie Boyd is a 36 y.o. female here for evaluation of right flank pain. Pain x 2 days. Constant. Does not radiate. No CP, SOB, cough. No fever, emesis, dysuria, hematuria. Still has gallbladder. Eating and drinking normally. Did recently start a new job with lifting. Denies chance of pregnancy. No sick contacts. No midline back pain, numbness, weakness. Will sometimes have generalized right abd pain. No change in BM.  HPI     Home Medications Prior to Admission medications   Medication Sig Start Date End Date Taking? Authorizing Provider  cephALEXin (KEFLEX) 500 MG capsule Take 1 capsule (500 mg total) by mouth 4 (four) times daily for 7 days. 11/01/22 11/08/22 Yes Lunabelle Oatley A, PA-C  ferrous sulfate 325 (65 FE) MG tablet Take 1 tablet (325 mg total) by mouth daily. 11/01/22  Yes Evaan Tidwell A, PA-C  metroNIDAZOLE (FLAGYL) 500 MG tablet Take 1 tablet (500 mg total) by mouth 2 (two) times daily. 11/01/22  Yes Liam Cammarata A, PA-C  acetaminophen (TYLENOL) 500 MG tablet Take 1 tablet (500 mg total) by mouth every 6 (six) hours as needed. 02/06/16   Mady GemmaWestfall, Elizabeth C, PA-C  albuterol (PROVENTIL HFA;VENTOLIN HFA) 108 (90 Base) MCG/ACT inhaler Inhale into the lungs. 10/17/16 12/10/17  [provider]  amLODipine (NORVASC) 10 MG tablet  04/17/17   [provider]  benzonatate (TESSALON) 100 MG capsule Take 1 capsule (100 mg total) by mouth every 8 (eight) hours. 05/31/21   Rylynn Kobs A, PA-C  fluticasone (FLONASE) 50 MCG/ACT nasal spray Place 2 sprays into both nostrils daily. 05/31/21   Annelie Boak A, PA-C  ibuprofen (ADVIL,MOTRIN) 400 MG tablet Take 1 tablet (400 mg total) by mouth every 6 (six) hours as needed. Patient not taking: Reported on 05/29/2017 01/17/16    Joycie Peekartner, Benjamin, PA-C  ibuprofen (ADVIL,MOTRIN) 800 MG tablet Take 1 tablet (800 mg total) by mouth 3 (three) times daily. Patient not taking: Reported on 05/29/2017 02/06/16   Mady GemmaWestfall, Elizabeth C, PA-C  pravastatin (PRAVACHOL) 40 MG tablet Take 40 mg by mouth. 04/17/17 04/17/18  [provider]  sucralfate (CARAFATE) 1 g tablet Take 1 tablet (1 g total) by mouth 4 (four) times daily -  with meals and at bedtime for 14 days. 05/31/21 06/14/21  Mael Delap A, PA-C  Vitamin D, Ergocalciferol, (DRISDOL) 50000 units CAPS capsule Take by mouth. 04/17/17   [provider]      Allergies    Patient has no known allergies.    Review of Systems   Review of Systems  Constitutional: Negative.   HENT: Negative.    Respiratory: Negative.    Cardiovascular: Negative.   Gastrointestinal:  Positive for abdominal pain. Negative for abdominal distention, anal bleeding, blood in stool, constipation, diarrhea, nausea, rectal pain and vomiting.  Genitourinary:  Positive for flank pain. Negative for decreased urine volume, difficulty urinating, dyspareunia, dysuria, frequency, genital sores, hematuria, menstrual problem, pelvic pain, urgency, vaginal bleeding, vaginal discharge and vaginal pain.  Skin: Negative.   Neurological: Negative.   All other systems reviewed and are negative.  Physical Exam Updated Vital Signs BP (!) 153/87 (BP Location: Right Arm)   Pulse (!) 101   Temp 98.9 F (37.2 C) (Oral)   Resp 18   Ht 5\' 6"  (1.676 m)  Wt 122.5 kg   LMP 10/18/2022 (Approximate)   SpO2 100%   BMI 43.58 kg/m  Physical Exam Vitals and nursing note reviewed.  Constitutional:      General: She is not in acute distress.    Appearance: She is well-developed. She is not ill-appearing, toxic-appearing or diaphoretic.  HENT:     Head: Atraumatic.  Eyes:     Pupils: Pupils are equal, round, and reactive to light.  Cardiovascular:     Rate and Rhythm: Normal rate.     Pulses: Normal  pulses.     Heart sounds: Normal heart sounds.  Pulmonary:     Effort: Pulmonary effort is normal. No respiratory distress.     Breath sounds: Normal breath sounds.  Abdominal:     General: Bowel sounds are normal. There is no distension.     Palpations: Abdomen is soft.     Tenderness: There is abdominal tenderness. There is right CVA tenderness. There is no left CVA tenderness, guarding or rebound. Negative signs include Murphy's sign and McBurney's sign.     Comments: Pos CVA tap on right  Musculoskeletal:        General: No swelling, tenderness, deformity or signs of injury. Normal range of motion.     Cervical back: Normal range of motion.     Right lower leg: No edema.     Left lower leg: No edema.  Skin:    General: Skin is warm and dry.     Capillary Refill: Capillary refill takes less than 2 seconds.  Neurological:     General: No focal deficit present.     Mental Status: She is alert and oriented to person, place, and time.  Psychiatric:        Mood and Affect: Mood normal.    ED Results / Procedures / Treatments   Labs (all labs ordered are listed, but only abnormal results are displayed) Labs Reviewed  URINALYSIS, ROUTINE W REFLEX MICROSCOPIC - Abnormal; Notable for the following components:      Result Value   APPearance CLOUDY (*)    Hgb urine dipstick SMALL (*)    Protein, ur 100 (*)    Leukocytes,Ua SMALL (*)    All other components within normal limits  CBC - Abnormal; Notable for the following components:   WBC 13.6 (*)    RBC 3.46 (*)    Hemoglobin 6.0 (*)    HCT 21.9 (*)    MCV 63.3 (*)    MCH 17.3 (*)    MCHC 27.4 (*)    RDW 19.9 (*)    Platelets 614 (*)    All other components within normal limits  COMPREHENSIVE METABOLIC PANEL - Abnormal; Notable for the following components:   Glucose, Bld 101 (*)    Creatinine, Ser 1.04 (*)    Calcium 8.2 (*)    Albumin 3.2 (*)    AST 14 (*)    All other components within normal limits  URINALYSIS,  MICROSCOPIC (REFLEX) - Abnormal; Notable for the following components:   Bacteria, UA FEW (*)    Trichomonas, UA PRESENT (*)    All other components within normal limits  URINE CULTURE  PREGNANCY, URINE  LIPASE, BLOOD    EKG None  Radiology CT ABDOMEN PELVIS W CONTRAST  Result Date: 11/01/2022 CLINICAL DATA:  Acute abdominal pain and flank pain. EXAM: CT ABDOMEN AND PELVIS WITH CONTRAST TECHNIQUE: Multidetector CT imaging of the abdomen and pelvis was performed using the standard protocol following bolus  administration of intravenous contrast. RADIATION DOSE REDUCTION: This exam was performed according to the departmental dose-optimization program which includes automated exposure control, adjustment of the mA and/or kV according to patient size and/or use of iterative reconstruction technique. CONTRAST:  OMNIPAQUE IOHEXOL 300 MG/ML  SOLN COMPARISON:  CT abdomen and pelvis 08/05/2015 FINDINGS: Lower chest: No acute abnormality. Hepatobiliary: No focal liver abnormality is seen. No gallstones, gallbladder wall thickening, or biliary dilatation. Pancreas: Unremarkable. No pancreatic ductal dilatation or surrounding inflammatory changes. Spleen: Normal in size without focal abnormality. Adrenals/Urinary Tract: There is patchy heterogeneous and hypodense enhancement throughout the right kidney with mild right perinephric fat stranding and mild prominence of the right renal pelvis with renal pelvis enhancement. No perinephric fluid collection or hydronephrosis. No urinary tract calculi. The left kidney, adrenal glands and bladder are within normal limits. Stomach/Bowel: Stomach is within normal limits. Appendix appears normal. No evidence of bowel wall thickening, distention, or inflammatory changes. There is sigmoid colon diverticulosis Vascular/Lymphatic: No significant vascular findings are present. No enlarged abdominal or pelvic lymph nodes. Reproductive: The uterus is markedly enlarged.  Rounded mass is seen within the uterus measuring 10 cm, new from prior. Ovaries are within normal limits. Other: Trace free fluid in the pelvis. No focal abdominal wall hernia. Musculoskeletal: No acute or significant osseous findings. IMPRESSION: 1. Findings compatible with right-sided pyelonephritis. No perinephric fluid collection. 2. Markedly enlarged uterus with 10 cm mass, likely a fibroid. This can be further evaluated with ultrasound. 3. Trace free fluid in the pelvis. 4. Sigmoid colon diverticulosis. Electronically Signed   By: Darliss Cheney M.D.   On: 11/01/2022 17:45    Procedures Procedures    Medications Ordered in ED Medications  cephALEXin (KEFLEX) capsule 500 mg (has no administration in time range)  iohexol (OMNIPAQUE) 300 MG/ML solution 100 mL (100 mLs Intravenous Contrast Given 11/01/22 1724)    ED Course/ Medical Decision Making/ A&P    36 year old here for evaluation of 2 days of right flank pain. No urinary sx. Has occasional right abd pain however not new. Pain not associated with PO intake. No CP, SOB, LE swelling. Did recent start new job which has some lifting however no midline tenderness, radicular sx.  Labs and imaging personally viewed and interpreted:  CBC leukocytosis 13.6, hemoglobin 6 baseline appears to be 9, admits to heavy menstrual cycles Metabolic panel creatinine 1.04 Lipase 27 UA small leuks, few bacteria, trichomonas present, sent for culture Pregnancy test negative CT abdomen pelvis shows right pyelonephritis, enlarged uterus  Patient reassessed.  I discussed her labs and imaging.  Does not need anything for pain or nausea while here in the emergency department.  She is tolerating p.o. intake.  Will start on Keflex for pyelonephritis.  I also discussed her anemia with hemoglobin at 6.  I recommended transfer to hospital that could provide blood transfusion.  Patient is not a blood transfusion at this time.  I discussed the risk versus benefit.  She  voices understanding.  She is currently asymptomatic.  I will start her on iron supplementation.  I encouraged her to return to the emergency department if she changes her mind as far as a blood transfusion is concerned.  I suspect this is likely due to her heavy menstrual cycles which she states she has had for a prolonged period of time.  She is supposed to be on iron supplementation however is not on this currently.  Have her follow-up outpatient, return for new or worsening symptoms  or if she changes her mind and decides that she would like a blood transfusion                            Medical Decision Making Amount and/or Complexity of Data Reviewed External Data Reviewed: labs, radiology and notes. Labs: ordered. Decision-making details documented in ED Course. Radiology: ordered and independent interpretation performed. Decision-making details documented in ED Course.  Risk OTC drugs. Prescription drug management. Parenteral controlled substances. Decision regarding hospitalization. Diagnosis or treatment significantly limited by social determinants of health.           Final Clinical Impression(s) / ED Diagnoses Final diagnoses:  Pyelonephritis  Trichomonas infection  Symptomatic anemia    Rx / DC Orders ED Discharge Orders          Ordered    cephALEXin (KEFLEX) 500 MG capsule  4 times daily        11/01/22 1802    metroNIDAZOLE (FLAGYL) 500 MG tablet  2 times daily        11/01/22 1802    ferrous sulfate 325 (65 FE) MG tablet  Daily        11/01/22 1802              Stoy Fenn A, PA-C 11/01/22 1810    Vanetta Mulders, MD 11/09/22 1519

## 2022-11-01 NOTE — ED Triage Notes (Signed)
Constant right sided flank pain x 2 days. Denies radiation, urinary sx, changes in Bms.

## 2022-11-01 NOTE — ED Notes (Signed)
Patient transported to CT 

## 2022-11-03 LAB — URINE CULTURE

## 2022-12-02 ENCOUNTER — Emergency Department (HOSPITAL_BASED_OUTPATIENT_CLINIC_OR_DEPARTMENT_OTHER)
Admission: EM | Admit: 2022-12-02 | Discharge: 2022-12-02 | Disposition: A | Discharge status: Home / Self Care | Payer: Medicaid Other | Attending: Emergency Medicine | Admitting: Emergency Medicine

## 2022-12-02 ENCOUNTER — Other Ambulatory Visit: Payer: Self-pay

## 2022-12-02 DIAGNOSIS — D72829 Elevated white blood cell count, unspecified: Secondary | ICD-10-CM | POA: Insufficient documentation

## 2022-12-02 DIAGNOSIS — M545 Low back pain, unspecified: Secondary | ICD-10-CM | POA: Diagnosis not present

## 2022-12-02 DIAGNOSIS — D649 Anemia, unspecified: Secondary | ICD-10-CM | POA: Insufficient documentation

## 2022-12-02 LAB — CBC WITH DIFFERENTIAL/PLATELET
Abs Immature Granulocytes: 0.01 10*3/uL (ref 0.00–0.07)
Basophils Absolute: 0.1 10*3/uL (ref 0.0–0.1)
Basophils Relative: 1 %
Eosinophils Absolute: 0.2 10*3/uL (ref 0.0–0.5)
Eosinophils Relative: 3 %
HCT: 30.2 % — ABNORMAL LOW (ref 36.0–46.0)
Hemoglobin: 8.4 g/dL — ABNORMAL LOW (ref 12.0–15.0)
Immature Granulocytes: 0 %
Lymphocytes Relative: 37 %
Lymphs Abs: 2.1 10*3/uL (ref 0.7–4.0)
MCH: 19.9 pg — ABNORMAL LOW (ref 26.0–34.0)
MCHC: 27.8 g/dL — ABNORMAL LOW (ref 30.0–36.0)
MCV: 71.4 fL — ABNORMAL LOW (ref 80.0–100.0)
Monocytes Absolute: 0.4 10*3/uL (ref 0.1–1.0)
Monocytes Relative: 8 %
Neutro Abs: 2.9 10*3/uL (ref 1.7–7.7)
Neutrophils Relative %: 51 %
Platelets: 538 10*3/uL — ABNORMAL HIGH (ref 150–400)
RBC: 4.23 MIL/uL (ref 3.87–5.11)
RDW: 26.3 % — ABNORMAL HIGH (ref 11.5–15.5)
Smear Review: NORMAL
WBC: 5.6 10*3/uL (ref 4.0–10.5)
nRBC: 0 % (ref 0.0–0.2)

## 2022-12-02 LAB — URINALYSIS, ROUTINE W REFLEX MICROSCOPIC
Bilirubin Urine: NEGATIVE
Glucose, UA: NEGATIVE mg/dL
Hgb urine dipstick: NEGATIVE
Ketones, ur: NEGATIVE mg/dL
Leukocytes,Ua: NEGATIVE
Nitrite: NEGATIVE
Protein, ur: NEGATIVE mg/dL
Specific Gravity, Urine: 1.02 (ref 1.005–1.030)
pH: 6 (ref 5.0–8.0)

## 2022-12-02 LAB — BASIC METABOLIC PANEL
Anion gap: 6 (ref 5–15)
BUN: 10 mg/dL (ref 6–20)
CO2: 22 mmol/L (ref 22–32)
Calcium: 8.8 mg/dL — ABNORMAL LOW (ref 8.9–10.3)
Chloride: 109 mmol/L (ref 98–111)
Creatinine, Ser: 0.75 mg/dL (ref 0.44–1.00)
GFR, Estimated: >60 mL/min (ref >60)
Glucose, Bld: 104 mg/dL — ABNORMAL HIGH (ref 70–99)
Potassium: 4.1 mmol/L (ref 3.5–5.1)
Sodium: 137 mmol/L (ref 135–145)

## 2022-12-02 LAB — PREGNANCY, URINE: Preg Test, Ur: NEGATIVE

## 2022-12-02 NOTE — ED Triage Notes (Signed)
Patient presents to ED via POV from home. Here with lower back pain. Denies recent injury or trauma. Ambulatory with steady gait. Reports she was seen here a month ago and told she was anemic "I want to get my levels rechecked".

## 2022-12-02 NOTE — Discharge Instructions (Signed)
Hemoglobin is improved significantly up to 8.5.  But still not anywhere close to normal.  But certainly better than the 6 she had a month ago.  Continue the iron.  Strongly recommend that you follow-up with OB/GYN or the health department for the heavy uterine bleeding.  May be secondary to fibroids but could be secondary to other things.  Urinalysis here today completely normal.  No signs of urinary tract infection.  Suspect that the discomfort in the right low back area is probably muscular in nature.  Would recommend taking extra strength Tylenol.  Could supplement with Motrin as well.  With Motrin can increase her bleeding.  Return for any new or worse symptoms.

## 2022-12-02 NOTE — ED Provider Notes (Signed)
[name]   CSN: [id number] Arrival date & time: 12/02/22  [id number]     History  Chief Complaint  Patient presents with   Back Pain    Selena Boyd is a 36 y.o. female.  Patient with a complaint of some right low back pain that started yesterday.  No fall or injury.  Patient also seen and on November 22 here for heavy vaginal bleeding and pyelonephritis.  Hemoglobin at that time was 6.  Was recommended patient follow-up with OB/GYN.  There was discussion about possible transfusion.  Also need for outpatient ultrasound.  Patient was treated for the pyelonephritis.  Patient states that that is gotten better.  But with her having some back pain she was concerned that maybe that had come back or urinary tract infection and come back.  Patient denies any nausea vomiting or fever chills no upper respiratory symptoms.  Patient has not done any outpatient follow-up since the November 22 ED visit.  But she has been taking iron.       Home Medications Prior to Admission medications   Medication Sig Start Date End Date Taking? Authorizing Provider  acetaminophen (TYLENOL) 500 MG tablet Take 1 tablet (500 mg total) by mouth every 6 (six) hours as needed. 02/06/16   Marella Chimes, PA-C  albuterol (PROVENTIL HFA;VENTOLIN HFA) 108 (90 Base) MCG/ACT inhaler Inhale into the lungs. 10/17/16 12/10/17  [provider]  amLODipine (NORVASC) 10 MG tablet  04/17/17   [provider]  benzonatate (TESSALON) 100 MG capsule Take 1 capsule (100 mg total) by mouth every 8 (eight) hours. 05/31/21   Henderly, Britni A, PA-C  ferrous sulfate 325 (65 FE) MG tablet Take 1 tablet (325 mg total) by mouth daily. 11/01/22   Henderly, Britni A, PA-C  fluticasone (FLONASE) 50 MCG/ACT nasal spray Place 2 sprays into both nostrils daily. 05/31/21   Henderly, Britni A, PA-C  ibuprofen (ADVIL,MOTRIN) 400 MG tablet Take 1 tablet (400 mg total) by mouth every 6  (six) hours as needed. Patient not taking: Reported on 05/29/2017 01/17/16   Comer Locket, PA-C  ibuprofen (ADVIL,MOTRIN) 800 MG tablet Take 1 tablet (800 mg total) by mouth 3 (three) times daily. Patient not taking: Reported on 05/29/2017 02/06/16   Marella Chimes, PA-C  metroNIDAZOLE (FLAGYL) 500 MG tablet Take 1 tablet (500 mg total) by mouth 2 (two) times daily. 11/01/22   Henderly, Britni A, PA-C  pravastatin (PRAVACHOL) 40 MG tablet Take 40 mg by mouth. 04/17/17 04/17/18  [provider]  sucralfate (CARAFATE) 1 g tablet Take 1 tablet (1 g total) by mouth 4 (four) times daily -  with meals and at bedtime for 14 days. 05/31/21 06/14/21  Henderly, Britni A, PA-C  Vitamin D, Ergocalciferol, (DRISDOL) 50000 units CAPS capsule Take by mouth. 04/17/17   [provider]      Allergies    Patient has no known allergies.    Review of Systems   Review of Systems  Constitutional:  Negative for chills and fever.  HENT:  Negative for ear pain and sore throat.   Eyes:  Negative for pain and visual disturbance.  Respiratory:  Negative for cough and shortness of breath.   Cardiovascular:  Negative for chest pain and palpitations.  Gastrointestinal:  Negative for abdominal pain and vomiting.  Genitourinary:  Negative for dysuria and hematuria.  Musculoskeletal:  Positive for back pain. Negative for arthralgias.  Skin:  Negative for color change and rash.  Neurological:  Negative for seizures and syncope.  All other systems reviewed and are negative.   Physical Exam Updated Vital Signs BP (!) 144/103   Pulse 85   Temp 98.6 F (37 C) (Oral)   Resp 17   SpO2 100%  Physical Exam Vitals and nursing note reviewed.  Constitutional:      General: She is not in acute distress.    Appearance: Normal appearance. She is well-developed.  HENT:     Head: Normocephalic and atraumatic.  Eyes:     Extraocular Movements: Extraocular movements intact.     Conjunctiva/sclera:  Conjunctivae normal.     Pupils: Pupils are equal, round, and reactive to light.  Cardiovascular:     Rate and Rhythm: Normal rate and regular rhythm.     Heart sounds: No murmur heard. Pulmonary:     Effort: Pulmonary effort is normal. No respiratory distress.     Breath sounds: Normal breath sounds.  Abdominal:     Palpations: Abdomen is soft.     Tenderness: There is no abdominal tenderness. There is no guarding.  Musculoskeletal:        General: No swelling or tenderness.     Cervical back: Normal range of motion and neck supple.     Comments: Back no palpable tenderness.  Skin:    General: Skin is warm and dry.     Capillary Refill: Capillary refill takes less than 2 seconds.  Neurological:     General: No focal deficit present.     Mental Status: She is alert and oriented to person, place, and time.     Cranial Nerves: No cranial nerve deficit.     Sensory: No sensory deficit.  Psychiatric:        Mood and Affect: Mood normal.     ED Results / Procedures / Treatments   Labs (all labs ordered are listed, but only abnormal results are displayed) Labs Reviewed  URINALYSIS, ROUTINE W REFLEX MICROSCOPIC - Abnormal; Notable for the following components:      Result Value   APPearance HAZY (*)    All other components within normal limits  CBC WITH DIFFERENTIAL/PLATELET - Abnormal; Notable for the following components:   Hemoglobin 8.4 (*)    HCT 30.2 (*)    MCV 71.4 (*)    MCH 19.9 (*)    MCHC 27.8 (*)    RDW 26.3 (*)    Platelets 538 (*)    All other components within normal limits  BASIC METABOLIC PANEL - Abnormal; Notable for the following components:   Glucose, Bld 104 (*)    Calcium 8.8 (*)    All other components within normal limits  PREGNANCY, URINE    EKG None  Radiology No results found.  Procedures Procedures    Medications Ordered in ED Medications - No data to display  ED Course/ Medical Decision Making/ A&P                            Medical Decision Making Amount and/or Complexity of Data Reviewed Labs: ordered.   Patient nontoxic no acute distress.  CBC white blood cell count 5.6 hemoglobin 8.4 today.  Platelets elevated at 538.  Patient metabolic panel normal renal function normal.  Urinalysis is hazy.  But normal.  Noes concerns for urinary tract infection.  Pregnancy test negative.  Patient will continue iron.  Reinforced that she really needs to follow-up with OB/GYN.  No signs of  urinary tract infection today.  Think the back pain is more musculoskeletal in nature.  Will treat him symptomatically.  Anemia has improved significantly although her hemoglobin is still not back to normal.  Patient denies any significant vaginal bleeding today.   Final Clinical Impression(s) / ED Diagnoses Final diagnoses:  Acute right-sided low back pain without sciatica  Anemia, unspecified type    Rx / DC Orders ED Discharge Orders     None         Fredia Sorrow, MD 12/02/22 1049

## 2023-05-03 ENCOUNTER — Emergency Department (HOSPITAL_BASED_OUTPATIENT_CLINIC_OR_DEPARTMENT_OTHER)
Admission: EM | Admit: 2023-05-03 | Discharge: 2023-05-03 | Disposition: A | Discharge status: Home / Self Care | Payer: Medicaid Other | Attending: Emergency Medicine | Admitting: Emergency Medicine

## 2023-05-03 ENCOUNTER — Encounter (HOSPITAL_BASED_OUTPATIENT_CLINIC_OR_DEPARTMENT_OTHER): Payer: Self-pay | Admitting: Pediatrics

## 2023-05-03 ENCOUNTER — Other Ambulatory Visit: Payer: Self-pay

## 2023-05-03 DIAGNOSIS — R519 Headache, unspecified: Secondary | ICD-10-CM | POA: Insufficient documentation

## 2023-05-03 DIAGNOSIS — M542 Cervicalgia: Secondary | ICD-10-CM | POA: Diagnosis not present

## 2023-05-03 LAB — PREGNANCY, URINE: Preg Test, Ur: NEGATIVE

## 2023-05-03 MED ORDER — SODIUM CHLORIDE 0.9 % IV BOLUS: 500.0000 mL | Freq: Once | INTRAVENOUS | Status: DC

## 2023-05-03 MED ORDER — PROCHLORPERAZINE EDISYLATE 10 MG/2ML IJ SOLN
10.0000 mg | Freq: Once | INTRAMUSCULAR | Status: DC
  Filled 2023-05-03: qty 2

## 2023-05-03 MED ORDER — ACETAMINOPHEN 325 MG PO TABS
650.0000 mg | ORAL_TABLET | Freq: Once | ORAL | Status: AC
  Administered 2023-05-03: 650 mg via ORAL
  Filled 2023-05-03: qty 2

## 2023-05-03 MED ORDER — DIPHENHYDRAMINE HCL 50 MG/ML IJ SOLN
25.0000 mg | Freq: Once | INTRAMUSCULAR | Status: DC
  Filled 2023-05-03: qty 1

## 2023-05-03 NOTE — ED Notes (Signed)
Patient did not want her medications IV as well as the IV itself. EDP notified and will change all meds to PO.

## 2023-05-03 NOTE — ED Provider Notes (Signed)
[facility] Provider Note   CSN: [id number] Arrival date & time: 05/03/23  1527     History  Chief Complaint  Patient presents with   Headache    Selena Boyd is a 37 y.o. female.  37 y.o female with a PMH of headaches presents to the ED with a chief complaint of headache which began this morning after dropping her son at school. Head occurred suddenly more so along the posterior aspect with no radiation has not tried any medication for it. She noted some blurry vision to the right eye but this has now resolved.  She did take her blood pressure while this was happening had a systolic in the 150's and diastolic in the 100's.  Patient is concerned due to her leg elevated blood pressure, her treatment includes 5 mg of amlodipine.  No neck pain, no trauma, no fever or other complaints.   The history is provided by the patient.  Headache Associated symptoms: neck pain   Associated symptoms: no abdominal pain, no fever and no sore throat        Home Medications Prior to Admission medications   Medication Sig Start Date End Date Taking? Authorizing Provider  acetaminophen (TYLENOL) 500 MG tablet Take 1 tablet (500 mg total) by mouth every 6 (six) hours as needed. 02/06/16   Mady Gemma, PA-C  albuterol (PROVENTIL HFA;VENTOLIN HFA) 108 (90 Base) MCG/ACT inhaler Inhale into the lungs. 10/17/16 12/10/17  [provider]  amLODipine (NORVASC) 10 MG tablet  04/17/17   [provider]  benzonatate (TESSALON) 100 MG capsule Take 1 capsule (100 mg total) by mouth every 8 (eight) hours. 05/31/21   Henderly, Britni A, PA-C  ferrous sulfate 325 (65 FE) MG tablet Take 1 tablet (325 mg total) by mouth daily. 11/01/22   Henderly, Britni A, PA-C  fluticasone (FLONASE) 50 MCG/ACT nasal spray Place 2 sprays into both nostrils daily. 05/31/21   Henderly, Britni A, PA-C  ibuprofen (ADVIL,MOTRIN) 400 MG tablet Take 1 tablet (400 mg  total) by mouth every 6 (six) hours as needed. Patient not taking: Reported on 05/29/2017 01/17/16   Joycie Peek, PA-C  ibuprofen (ADVIL,MOTRIN) 800 MG tablet Take 1 tablet (800 mg total) by mouth 3 (three) times daily. Patient not taking: Reported on 05/29/2017 02/06/16   Mady Gemma, PA-C  metroNIDAZOLE (FLAGYL) 500 MG tablet Take 1 tablet (500 mg total) by mouth 2 (two) times daily. 11/01/22   Henderly, Britni A, PA-C  pravastatin (PRAVACHOL) 40 MG tablet Take 40 mg by mouth. 04/17/17 04/17/18  [provider]  sucralfate (CARAFATE) 1 g tablet Take 1 tablet (1 g total) by mouth 4 (four) times daily -  with meals and at bedtime for 14 days. 05/31/21 06/14/21  Henderly, Britni A, PA-C  Vitamin D, Ergocalciferol, (DRISDOL) 50000 units CAPS capsule Take by mouth. 04/17/17   [provider]      Allergies    Patient has no known allergies.    Review of Systems   Review of Systems  Constitutional:  Negative for chills and fever.  HENT:  Negative for sore throat.   Respiratory:  Negative for shortness of breath.   Cardiovascular:  Negative for chest pain.  Gastrointestinal:  Negative for abdominal pain.  Musculoskeletal:  Positive for neck pain.  Neurological:  Positive for headaches.  All other systems reviewed and are negative.   Physical Exam Updated Vital Signs BP (!) 146/83 (BP Location: Right Arm)  Pulse 85   Temp 98.9 F (37.2 C) (Oral)   Resp 18   Ht 5\' 6"  (1.676 m)   Wt 124 kg   SpO2 100%   BMI 44.12 kg/m  Physical Exam Vitals and nursing note reviewed.  Constitutional:      Appearance: She is well-developed.  HENT:     Head: Normocephalic and atraumatic.  Cardiovascular:     Rate and Rhythm: Normal rate.  Pulmonary:     Effort: Pulmonary effort is normal.  Abdominal:     Palpations: Abdomen is soft.  Musculoskeletal:     Cervical back: Normal range of motion and neck supple.  Skin:    General: Skin is warm and dry.  Neurological:      Mental Status: She is alert and oriented to person, place, and time.     GCS: GCS eye subscore is 4. GCS verbal subscore is 5. GCS motor subscore is 6.     Comments: Alert, oriented, thought content appropriate. Speech fluent without evidence of aphasia. Able to follow 2 step commands without difficulty.  Cranial Nerves:  II:  Peripheral visual fields grossly normal, pupils, round, reactive to light III,IV, VI: ptosis not present, extra-ocular motions intact bilaterally  V,VII: smile symmetric, facial light touch sensation equal VIII: hearing grossly normal bilaterally  IX,X: midline uvula rise  XI: bilateral shoulder shrug equal and strong XII: midline tongue extension  Motor:  5/5 in upper and lower extremities bilaterally including strong and equal grip strength and dorsiflexion/plantar flexion Sensory: light touch normal in all extremities.  Cerebellar: normal finger-to-nose with bilateral upper extremities, pronator drift negative Gait: normal gait and balance       ED Results / Procedures / Treatments   Labs (all labs ordered are listed, but only abnormal results are displayed) Labs Reviewed  PREGNANCY, URINE    EKG None  Radiology No results found.  Procedures Procedures    Medications Ordered in ED Medications  acetaminophen (TYLENOL) tablet 650 mg (650 mg Oral Given 05/03/23 1644)    ED Course/ Medical Decision Making/ A&P                             Medical Decision Making Amount and/or Complexity of Data Reviewed Labs: ordered.  Risk OTC drugs. Prescription drug management.       This patient presents to the ED for concern of headache, this involves a number of treatment options, and is a complaint that carries with it a high risk of complications and morbidity.  The differential diagnosis includes intracranial hemorrhage, CVA versus bad headache.     Co morbidities: Discussed in HPI   Brief History:  See HPI.   EMR reviewed including pt  PMHx, past surgical history and past visits to ER.   See HPI for more details   Lab Tests:  I ordered and independently interpreted labs.  The pertinent results include:    N/A  Imaging Studies:  No imaging studies ordered for this patient  Medicines ordered:  I ordered medication including tylenol  for headache Reevaluation of the patient after these medicines showed that the patient improved I have reviewed the patients home medicines and have made adjustments as needed  Reevaluation:  After the interventions noted above I re-evaluated patient and found that they have :improved   Social Determinants of Health:  The patient's social determinants of health were a factor in the care of this patient  Problem List /  ED Course:  Patient presents to the ED with a headache that began after she dropped of her son today, does have a history of headaches, has not taken anything for improvement in symptoms.  Did measure her blood pressure and noted that it was elevated with a systolic in the 150s diastolic in the 100s.  She is currently on amlodipine 5 mg daily, reports she is not her blood pressure to be slightly higher.  Did feel like her eyes were heavy however there is no vision loss.  No focal weakness, no nausea, no vomiting.  No prior history of aneurysms, no trauma, currently on no blood thinners.  Ambulating in the emergency department with a steady gait.  Has a normal neuroexam here on evaluation.  Blood pressure is 146/83 while in the ED, attempted to give her headache cocktail, however patient refused medications at this time as she does not want to be drowsy.  We discussed providing her with Tylenol, will follow-up with PCP if blood pressure continues to trend upwards.  I do not suspect intracranial pathology at this time.  Low suspicion for hypertensive urgency versus emergency with good blood pressure control here in the ED.  Patient is hemodynamically stable for  discharge.  Dispostion:  After consideration of the diagnostic results and the patients response to treatment, I feel that the patent would benefit from outpatient follow up with PCP.     Portions of this note were generated with Scientist, clinical (histocompatibility and immunogenetics). Dictation errors may occur despite best attempts at proofreading.   Final Clinical Impression(s) / ED Diagnoses Final diagnoses:  Bad headache    Rx / DC Orders ED Discharge Orders     None         Claude Manges, PA-C 05/03/23 1700    Glyn Ade, MD 05/11/23 [phone]

## 2023-05-03 NOTE — Discharge Instructions (Signed)
You may continue following your blood pressure over the next couple days.  If you experience any nausea, vomiting, worsening headache you will need to return to the emergency department.

## 2023-05-03 NOTE — ED Triage Notes (Signed)
C/O posterior headache started today. C/O high BP with hx but has not been on medication x 1 yr.

## 2023-06-23 ENCOUNTER — Encounter (HOSPITAL_BASED_OUTPATIENT_CLINIC_OR_DEPARTMENT_OTHER): Payer: Self-pay | Admitting: Emergency Medicine

## 2023-06-23 ENCOUNTER — Emergency Department (HOSPITAL_BASED_OUTPATIENT_CLINIC_OR_DEPARTMENT_OTHER)
Admission: EM | Admit: 2023-06-23 | Discharge: 2023-06-23 | Disposition: A | Discharge status: Home / Self Care | Payer: Medicaid Other | Attending: Emergency Medicine | Admitting: Emergency Medicine

## 2023-06-23 ENCOUNTER — Emergency Department (HOSPITAL_BASED_OUTPATIENT_CLINIC_OR_DEPARTMENT_OTHER): Payer: Medicaid Other

## 2023-06-23 ENCOUNTER — Other Ambulatory Visit: Payer: Self-pay

## 2023-06-23 DIAGNOSIS — R202 Paresthesia of skin: Secondary | ICD-10-CM | POA: Insufficient documentation

## 2023-06-23 DIAGNOSIS — Y92410 Unspecified street and highway as the place of occurrence of the external cause: Secondary | ICD-10-CM | POA: Insufficient documentation

## 2023-06-23 DIAGNOSIS — S161XXA Strain of muscle, fascia and tendon at neck level, initial encounter: Secondary | ICD-10-CM | POA: Diagnosis not present

## 2023-06-23 DIAGNOSIS — M546 Pain in thoracic spine: Secondary | ICD-10-CM | POA: Diagnosis not present

## 2023-06-23 DIAGNOSIS — R519 Headache, unspecified: Secondary | ICD-10-CM | POA: Diagnosis not present

## 2023-06-23 DIAGNOSIS — S199XXA Unspecified injury of neck, initial encounter: Secondary | ICD-10-CM | POA: Diagnosis present

## 2023-06-23 DIAGNOSIS — E119 Type 2 diabetes mellitus without complications: Secondary | ICD-10-CM | POA: Diagnosis not present

## 2023-06-23 MED ORDER — METHOCARBAMOL 500 MG PO TABS: 500.0000 mg | ORAL_TABLET | Freq: Three times a day (TID) | ORAL | 0 refills | Status: DC | PRN

## 2023-06-23 MED ORDER — DICLOFENAC SODIUM 1 % EX GEL: 2.0000 g | Freq: Four times a day (QID) | CUTANEOUS | 0 refills | Status: DC | PRN

## 2023-06-23 NOTE — Discharge Instructions (Signed)
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
## 2023-11-19 ENCOUNTER — Emergency Department (HOSPITAL_BASED_OUTPATIENT_CLINIC_OR_DEPARTMENT_OTHER): Payer: Medicaid Other

## 2023-11-19 ENCOUNTER — Encounter (HOSPITAL_BASED_OUTPATIENT_CLINIC_OR_DEPARTMENT_OTHER): Payer: Self-pay

## 2023-11-19 ENCOUNTER — Emergency Department (HOSPITAL_BASED_OUTPATIENT_CLINIC_OR_DEPARTMENT_OTHER)
Admission: EM | Admit: 2023-11-19 | Discharge: 2023-11-19 | Disposition: A | Discharge status: Home / Self Care | Payer: Medicaid Other | Attending: Emergency Medicine | Admitting: Emergency Medicine

## 2023-11-19 ENCOUNTER — Other Ambulatory Visit: Payer: Self-pay

## 2023-11-19 DIAGNOSIS — E119 Type 2 diabetes mellitus without complications: Secondary | ICD-10-CM | POA: Diagnosis not present

## 2023-11-19 DIAGNOSIS — R042 Hemoptysis: Secondary | ICD-10-CM | POA: Insufficient documentation

## 2023-11-19 LAB — CBC
HCT: 30.3 % — ABNORMAL LOW (ref 36.0–46.0)
Hemoglobin: 8.9 g/dL — ABNORMAL LOW (ref 12.0–15.0)
MCH: 21.6 pg — ABNORMAL LOW (ref 26.0–34.0)
MCHC: 29.4 g/dL — ABNORMAL LOW (ref 30.0–36.0)
MCV: 73.5 fL — ABNORMAL LOW (ref 80.0–100.0)
Platelets: 535 10*3/uL — ABNORMAL HIGH (ref 150–400)
RBC: 4.12 MIL/uL (ref 3.87–5.11)
RDW: 19.5 % — ABNORMAL HIGH (ref 11.5–15.5)
WBC: 6.4 10*3/uL (ref 4.0–10.5)
nRBC: 0 % (ref 0.0–0.2)

## 2023-11-19 LAB — BASIC METABOLIC PANEL
Anion gap: 7 (ref 5–15)
BUN: 13 mg/dL (ref 6–20)
CO2: 22 mmol/L (ref 22–32)
Calcium: 8.8 mg/dL — ABNORMAL LOW (ref 8.9–10.3)
Chloride: 108 mmol/L (ref 98–111)
Creatinine, Ser: 0.76 mg/dL (ref 0.44–1.00)
GFR, Estimated: >60 mL/min (ref >60)
Glucose, Bld: 92 mg/dL (ref 70–99)
Potassium: 4 mmol/L (ref 3.5–5.1)
Sodium: 137 mmol/L (ref 135–145)

## 2023-11-19 LAB — PREGNANCY, URINE: Preg Test, Ur: NEGATIVE

## 2023-11-19 MED ORDER — IOHEXOL 350 MG/ML SOLN
100.0000 mL | Freq: Once | INTRAVENOUS | Status: AC | PRN
  Administered 2023-11-19: 100 mL via INTRAVENOUS
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
## 2023-11-19 NOTE — ED Notes (Signed)
ED Provider at bedside. 

## 2023-11-19 NOTE — Discharge Instructions (Signed)
Your blood work and CT scan today were reassuring.  I recommend follow-up with your doctor.  If you develop any additional bleeding, chest pain, difficulty breathing or any other concerns you should return to the ED.

## 2023-11-19 NOTE — ED Notes (Signed)
Patient transported to CT 

## 2023-11-19 NOTE — ED Triage Notes (Addendum)
Pt arrives with c/o coughing up blood after she was clearing her throat. Pt reports there was blood mixed with some mucous when she coughed it up. Pt started TXA recently to help reduce bleeding with her period and was concerned the medication caused this episode. Pt denies SOB or CP. Pt has hx of HTN.

## 2024-08-14 ENCOUNTER — Encounter (HOSPITAL_BASED_OUTPATIENT_CLINIC_OR_DEPARTMENT_OTHER): Payer: Self-pay | Admitting: Emergency Medicine

## 2024-08-14 ENCOUNTER — Emergency Department (HOSPITAL_BASED_OUTPATIENT_CLINIC_OR_DEPARTMENT_OTHER)

## 2024-08-14 ENCOUNTER — Other Ambulatory Visit: Payer: Self-pay

## 2024-08-14 ENCOUNTER — Emergency Department (HOSPITAL_BASED_OUTPATIENT_CLINIC_OR_DEPARTMENT_OTHER)
Admission: EM | Admit: 2024-08-14 | Discharge: 2024-08-14 | Disposition: A | Discharge status: Home / Self Care | Attending: Emergency Medicine | Admitting: Emergency Medicine

## 2024-08-14 DIAGNOSIS — E119 Type 2 diabetes mellitus without complications: Secondary | ICD-10-CM | POA: Diagnosis not present

## 2024-08-14 DIAGNOSIS — R519 Headache, unspecified: Secondary | ICD-10-CM | POA: Diagnosis not present

## 2024-08-14 DIAGNOSIS — R0789 Other chest pain: Secondary | ICD-10-CM | POA: Insufficient documentation

## 2024-08-14 DIAGNOSIS — S46912A Strain of unspecified muscle, fascia and tendon at shoulder and upper arm level, left arm, initial encounter: Secondary | ICD-10-CM | POA: Insufficient documentation

## 2024-08-14 DIAGNOSIS — M25552 Pain in left hip: Secondary | ICD-10-CM | POA: Diagnosis not present

## 2024-08-14 DIAGNOSIS — Y92410 Unspecified street and highway as the place of occurrence of the external cause: Secondary | ICD-10-CM | POA: Diagnosis not present

## 2024-08-14 DIAGNOSIS — S199XXA Unspecified injury of neck, initial encounter: Secondary | ICD-10-CM | POA: Diagnosis present

## 2024-08-14 DIAGNOSIS — S161XXA Strain of muscle, fascia and tendon at neck level, initial encounter: Secondary | ICD-10-CM | POA: Insufficient documentation

## 2024-08-14 LAB — PREGNANCY, URINE: Preg Test, Ur: NEGATIVE

## 2024-08-14 MED ORDER — METHOCARBAMOL 500 MG PO TABS: 500.0000 mg | ORAL_TABLET | Freq: Three times a day (TID) | ORAL | 0 refills | Status: AC | PRN

## 2024-08-14 MED ORDER — DICLOFENAC SODIUM 1 % EX GEL: 2.0000 g | Freq: Four times a day (QID) | CUTANEOUS | 0 refills | Status: AC | PRN
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
## 2024-08-14 NOTE — Discharge Instructions (Signed)
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
## 2024-10-31 ENCOUNTER — Other Ambulatory Visit: Payer: Self-pay

## 2024-10-31 ENCOUNTER — Emergency Department (HOSPITAL_BASED_OUTPATIENT_CLINIC_OR_DEPARTMENT_OTHER)

## 2024-10-31 ENCOUNTER — Encounter (HOSPITAL_BASED_OUTPATIENT_CLINIC_OR_DEPARTMENT_OTHER): Payer: Self-pay

## 2024-10-31 ENCOUNTER — Emergency Department (HOSPITAL_BASED_OUTPATIENT_CLINIC_OR_DEPARTMENT_OTHER)
Admission: EM | Admit: 2024-10-31 | Discharge: 2024-10-31 | Disposition: A | Discharge status: Home / Self Care | Attending: Emergency Medicine | Admitting: Emergency Medicine

## 2024-10-31 DIAGNOSIS — R10A3 Flank pain, bilateral: Secondary | ICD-10-CM | POA: Diagnosis present

## 2024-10-31 DIAGNOSIS — N12 Tubulo-interstitial nephritis, not specified as acute or chronic: Secondary | ICD-10-CM | POA: Diagnosis not present

## 2024-10-31 LAB — COMPREHENSIVE METABOLIC PANEL WITH GFR
ALT: 10 U/L (ref 0–44)
AST: 15 U/L (ref 15–41)
Albumin: 3.9 g/dL (ref 3.5–5.0)
Alkaline Phosphatase: 51 U/L (ref 38–126)
Anion gap: 11 (ref 5–15)
BUN: 12 mg/dL (ref 6–20)
CO2: 20 mmol/L — ABNORMAL LOW (ref 22–32)
Calcium: 8.5 mg/dL — ABNORMAL LOW (ref 8.9–10.3)
Chloride: 109 mmol/L (ref 98–111)
Creatinine, Ser: 0.82 mg/dL (ref 0.44–1.00)
GFR, Estimated: >60 mL/min (ref >60)
Glucose, Bld: 147 mg/dL — ABNORMAL HIGH (ref 70–99)
Potassium: 3.9 mmol/L (ref 3.5–5.1)
Sodium: 140 mmol/L (ref 135–145)
Total Bilirubin: <0.2 mg/dL (ref 0.0–1.2)
Total Protein: 6.9 g/dL (ref 6.5–8.1)

## 2024-10-31 LAB — URINALYSIS, MICROSCOPIC (REFLEX)
RBC / HPF: >50 RBC/hpf (ref 0–5)
WBC, UA: >50 WBC/hpf (ref 0–5)

## 2024-10-31 LAB — HCG, SERUM, QUALITATIVE: Preg, Serum: NEGATIVE

## 2024-10-31 LAB — URINALYSIS, ROUTINE W REFLEX MICROSCOPIC

## 2024-10-31 LAB — CBC WITH DIFFERENTIAL/PLATELET
Abs Immature Granulocytes: 0.03 K/uL (ref 0.00–0.07)
Basophils Absolute: 0.1 K/uL (ref 0.0–0.1)
Basophils Relative: 1 %
Eosinophils Absolute: 0.2 K/uL (ref 0.0–0.5)
Eosinophils Relative: 2 %
HCT: 31.2 % — ABNORMAL LOW (ref 36.0–46.0)
Hemoglobin: 9.3 g/dL — ABNORMAL LOW (ref 12.0–15.0)
Immature Granulocytes: 0 %
Lymphocytes Relative: 29 %
Lymphs Abs: 2.1 K/uL (ref 0.7–4.0)
MCH: 24 pg — ABNORMAL LOW (ref 26.0–34.0)
MCHC: 29.8 g/dL — ABNORMAL LOW (ref 30.0–36.0)
MCV: 80.6 fL (ref 80.0–100.0)
Monocytes Absolute: 0.5 K/uL (ref 0.1–1.0)
Monocytes Relative: 7 %
Neutro Abs: 4.6 K/uL (ref 1.7–7.7)
Neutrophils Relative %: 61 %
Platelets: 494 K/uL — ABNORMAL HIGH (ref 150–400)
RBC: 3.87 MIL/uL (ref 3.87–5.11)
RDW: 19.3 % — ABNORMAL HIGH (ref 11.5–15.5)
WBC: 7.5 K/uL (ref 4.0–10.5)
nRBC: 0 % (ref 0.0–0.2)

## 2024-10-31 LAB — LIPASE, BLOOD: Lipase: 29 U/L (ref 11–51)

## 2024-10-31 MED ORDER — CEPHALEXIN 500 MG PO CAPS: 500.0000 mg | ORAL_CAPSULE | Freq: Four times a day (QID) | ORAL | 0 refills | Status: AC

## 2024-10-31 MED ORDER — MELOXICAM 7.5 MG PO TABS: 7.5000 mg | ORAL_TABLET | Freq: Every day | ORAL | 0 refills | Status: AC

## 2024-10-31 MED ORDER — IOHEXOL 300 MG/ML  SOLN
100.0000 mL | Freq: Once | INTRAMUSCULAR | Status: AC | PRN
  Administered 2024-10-31: 100 mL via INTRAVENOUS

## 2024-10-31 NOTE — Discharge Instructions (Signed)
 It is a pleasure taking care of you here today  As we discussed you likely have an infection in your kidney.  We are starting on antibiotics and anti-inflammatory medication.  Take as prescribed.  Make sure to follow-up outpatient, return for any worsening symptoms

## 2024-10-31 NOTE — ED Notes (Signed)
 Patient transferred from waiting room to ED treatment room. Assuming pt care at this time.

## 2024-10-31 NOTE — ED Provider Notes (Signed)
 [place] [facility] Provider Note   CSN: [id number] Arrival date & time: 10/31/24  1542    Patient presents with: Flank Pain   Selena Boyd is a 38 y.o. female flank pain x 2 week worse on right, does not radiate. Taking Ibuprofen . No trauma or recent illness.  Mild dysuria without hematuria.  No fever, chest pain, shortness of breath, changes in bowel movements.  No pain or swelling to legs.  No history of PE or DVT.     HPI     Prior to Admission medications   Medication Sig Start Date End Date Taking? Authorizing Provider  cephALEXin  (KEFLEX ) 500 MG capsule Take 1 capsule (500 mg total) by mouth 4 (four) times daily. 10/31/24  Yes Tashai Catino A, PA-C  meloxicam  (MOBIC ) 7.5 MG tablet Take 1 tablet (7.5 mg total) by mouth daily. 10/31/24  Yes Antoinette Borgwardt A, PA-C  acetaminophen  (TYLENOL ) 500 MG tablet Take 1 tablet (500 mg total) by mouth every 6 (six) hours as needed. 02/06/16   Veronica Almarie BROCKS, PA-C  albuterol  (PROVENTIL  HFA;VENTOLIN  HFA) 108 (90 Base) MCG/ACT inhaler Inhale into the lungs. 10/17/16 12/10/17  [provider]  amLODipine (NORVASC) 10 MG tablet  04/17/17   [provider]  benzonatate  (TESSALON ) 100 MG capsule Take 1 capsule (100 mg total) by mouth every 8 (eight) hours. 05/31/21   Alechia Lezama A, PA-C  diclofenac  Sodium (VOLTAREN ) 1 % GEL Apply 2 g topically 4 (four) times daily as needed. 08/14/24   Long, Fonda MATSU, MD  ferrous sulfate  325 (65 FE) MG tablet Take 1 tablet (325 mg total) by mouth daily. 11/01/22   Oshay Stranahan A, PA-C  fluticasone  (FLONASE ) 50 MCG/ACT nasal spray Place 2 sprays into both nostrils daily. 05/31/21   Michoel Kunin A, PA-C  ibuprofen  (ADVIL ,MOTRIN ) 400 MG tablet Take 1 tablet (400 mg total) by mouth every 6 (six) hours as needed. Patient not taking: Reported on 05/29/2017 01/17/16   Arlinda Katz, PA-C  ibuprofen  (ADVIL ,MOTRIN ) 800 MG tablet Take 1 tablet (800  mg total) by mouth 3 (three) times daily. Patient not taking: Reported on 05/29/2017 02/06/16   Veronica Almarie BROCKS, PA-C  methocarbamol  (ROBAXIN ) 500 MG tablet Take 1 tablet (500 mg total) by mouth every 8 (eight) hours as needed for muscle spasms. 08/14/24   Long, Fonda MATSU, MD  metroNIDAZOLE  (FLAGYL ) 500 MG tablet Take 1 tablet (500 mg total) by mouth 2 (two) times daily. 11/01/22   Tc Kapusta A, PA-C  pravastatin (PRAVACHOL) 40 MG tablet Take 40 mg by mouth. 04/17/17 04/17/18  [provider]  sucralfate  (CARAFATE ) 1 g tablet Take 1 tablet (1 g total) by mouth 4 (four) times daily -  with meals and at bedtime for 14 days. 05/31/21 06/14/21  Jacinto Keil A, PA-C  Vitamin D, Ergocalciferol, (DRISDOL) 50000 units CAPS capsule Take by mouth. 04/17/17   [provider]    Allergies: Patient has no known allergies.    Review of Systems  Constitutional: Negative.   HENT: Negative.    Respiratory: Negative.    Cardiovascular: Negative.   Gastrointestinal:  Positive for abdominal pain and nausea. Negative for abdominal distention, anal bleeding, blood in stool, constipation, diarrhea, rectal pain and vomiting.  Genitourinary:  Positive for flank pain. Negative for difficulty urinating, dysuria, frequency, genital sores, hematuria, menstrual problem, pelvic pain, urgency, vaginal bleeding, vaginal discharge and vaginal pain.  Skin: Negative.   Neurological: Negative.   All other systems reviewed and  are negative.   Updated Vital Signs BP (!) 156/100 (BP Location: Left Arm)   Pulse 74   Temp 99.2 F (37.3 C) (Oral)   Resp 18   Ht 5' 6 (1.676 m)   Wt 131.5 kg   LMP 10/28/2024   SpO2 100%   BMI 46.81 kg/m   Physical Exam Vitals and nursing note reviewed.  Constitutional:      General: She is not in acute distress.    Appearance: She is well-developed. She is not ill-appearing, toxic-appearing or diaphoretic.  HENT:     Head: Normocephalic and atraumatic.     Nose:  Nose normal.     Mouth/Throat:     Mouth: Mucous membranes are moist.  Eyes:     Pupils: Pupils are equal, round, and reactive to light.  Cardiovascular:     Rate and Rhythm: Normal rate.     Pulses: Normal pulses.     Heart sounds: Normal heart sounds.  Pulmonary:     Effort: Pulmonary effort is normal. No respiratory distress.     Breath sounds: Normal breath sounds.  Abdominal:     General: Bowel sounds are normal. There is no distension.     Palpations: Abdomen is soft.     Tenderness: There is abdominal tenderness.     Comments: Diffuse tenderness bilateral flank, anterior abdomen soft, nontender  Musculoskeletal:        General: No swelling or tenderness. Normal range of motion.     Cervical back: Normal range of motion.     Right lower leg: No edema.     Left lower leg: No edema.     Comments: Full range of motion, no bony tenderness, compartment soft  Skin:    General: Skin is warm and dry.     Capillary Refill: Capillary refill takes less than 2 seconds.  Neurological:     General: No focal deficit present.     Mental Status: She is alert.     Cranial Nerves: No cranial nerve deficit.     Sensory: No sensory deficit.     Motor: No weakness.     Gait: Gait normal.  Psychiatric:        Mood and Affect: Mood normal.    (all labs ordered are listed, but only abnormal results are displayed) Labs Reviewed  CBC WITH DIFFERENTIAL/PLATELET - Abnormal; Notable for the following components:      Result Value   Hemoglobin 9.3 (*)    HCT 31.2 (*)    MCH 24.0 (*)    MCHC 29.8 (*)    RDW 19.3 (*)    Platelets 494 (*)    All other components within normal limits  COMPREHENSIVE METABOLIC PANEL WITH GFR - Abnormal; Notable for the following components:   CO2 20 (*)    Glucose, Bld 147 (*)    Calcium 8.5 (*)    All other components within normal limits  URINALYSIS, ROUTINE W REFLEX MICROSCOPIC - Abnormal; Notable for the following components:   Color, Urine RED (*)     APPearance TURBID (*)    Glucose, UA   (*)    Value: TEST NOT REPORTED DUE TO COLOR INTERFERENCE OF URINE PIGMENT   Hgb urine dipstick   (*)    Value: TEST NOT REPORTED DUE TO COLOR INTERFERENCE OF URINE PIGMENT   Bilirubin Urine   (*)    Value: TEST NOT REPORTED DUE TO COLOR INTERFERENCE OF URINE PIGMENT   Ketones, ur   (*)  Value: TEST NOT REPORTED DUE TO COLOR INTERFERENCE OF URINE PIGMENT   Protein, ur   (*)    Value: TEST NOT REPORTED DUE TO COLOR INTERFERENCE OF URINE PIGMENT   Nitrite   (*)    Value: TEST NOT REPORTED DUE TO COLOR INTERFERENCE OF URINE PIGMENT   Leukocytes,Ua   (*)    Value: TEST NOT REPORTED DUE TO COLOR INTERFERENCE OF URINE PIGMENT   All other components within normal limits  URINALYSIS, MICROSCOPIC (REFLEX) - Abnormal; Notable for the following components:   Bacteria, UA MANY (*)    All other components within normal limits  LIPASE, BLOOD  HCG, SERUM, QUALITATIVE    EKG: None  Radiology: CT ABDOMEN PELVIS W CONTRAST Result Date: 10/31/2024 CLINICAL DATA:  Acute abdominal pain EXAM: CT ABDOMEN AND PELVIS WITH CONTRAST TECHNIQUE: Multidetector CT imaging of the abdomen and pelvis was performed using the standard protocol following bolus administration of intravenous contrast. RADIATION DOSE REDUCTION: This exam was performed according to the departmental dose-optimization program which includes automated exposure control, adjustment of the mA and/or kV according to patient size and/or use of iterative reconstruction technique. CONTRAST:  OMNIPAQUE  IOHEXOL  300 MG/ML  SOLN COMPARISON:  CT abdomen and pelvis 11/01/2022 FINDINGS: Lower chest: No acute abnormality. Hepatobiliary: No focal liver abnormality is seen. No gallstones, gallbladder wall thickening, or biliary dilatation. Pancreas: Unremarkable. No pancreatic ductal dilatation or surrounding inflammatory changes. Spleen: Normal in size without focal abnormality. Adrenals/Urinary Tract: There are few  subcentimeter rounded hypodensities in the kidneys which are too small to characterize, likely cysts. Otherwise, kidneys, adrenal glands and bladder are within normal limits. Stomach/Bowel: Stomach is within normal limits. Appendix appears normal. No evidence of bowel wall thickening, distention, or inflammatory changes. Vascular/Lymphatic: No significant vascular findings are present. No enlarged abdominal or pelvic lymph nodes. Reproductive: Enlarged uterus containing 12 cm fibroid present. This fibroid has increased in size. Ovaries are not well defined on this study. Other: No abdominal wall hernia or abnormality. No abdominopelvic ascites. Musculoskeletal: No fracture is seen. IMPRESSION: 1. No acute localizing process in the abdomen or pelvis. 2. Enlarged uterus containing 12 cm fibroid. Electronically Signed   By: Greig Pique M.D.   On: 10/31/2024 19:14     Procedures   Medications Ordered in the ED  iohexol  (OMNIPAQUE ) 300 MG/ML solution 100 mL (100 mLs Intravenous Contrast Given 10/31/24 4465)    38 year old here for evaluation of flank pain.  Ongoing over the last 2 weeks.  Bilateral however right greater than left.  No fever, nausea or vomiting.  Initially was tachycardic with a heart rate of 104 however denies any risk factors for PE or DVT.  She denies any chest pain, shortness of breath, cough or hemoptysis.  She does state her symptoms feel similar to when she had pyelonephritis on the right a few years ago.  Will plan on labs, imaging, reassess.  She does not want anything for pain at this time.  Labs and imaging personally viewed and interpreted:  CBC without leukocytosis, hemoglobin 9.3 CMP wo acute abnormality Lipase 29 Preg neg UA many bacteria, large blood- on menstrual cycle CT AP with enlarged fibroid uterus, chronic, no acute process  Patient reassessed.  Discussed labs and imaging.  Given urine, symptoms will treat for pyelonephritis.  Will have her follow-up  outpatient, return for new or worsening symptoms.  Patient is nontoxic, nonseptic appearing, in no apparent distress.  Patient's pain and other symptoms adequately managed in emergency department.  Fluid  bolus given.  Labs, imaging and vitals reviewed.  Patient does not meet the SIRS or Sepsis criteria.  On repeat exam patient does not have a surgical abdomin and there are no peritoneal signs.  No indication of appendicitis, bowel obstruction, bowel perforation, cholecystitis, diverticulitis, PID, intermittent, persistent torsion, TOA, ectopic pregnancy, AAA, dissection, traumatic injury, PE, pneumonia, pneumothorax.  Patient discharged home with symptomatic treatment and given strict instructions for follow-up with their primary care physician.  I have also discussed reasons to return immediately to the ER.  Patient expresses understanding and agrees with plan.                                      Medical Decision Making Amount and/or Complexity of Data Reviewed External Data Reviewed: labs, radiology and notes. Labs: ordered. Decision-making details documented in ED Course. Radiology: ordered and independent interpretation performed. Decision-making details documented in ED Course.  Risk OTC drugs. Prescription drug management. Decision regarding hospitalization. Diagnosis or treatment significantly limited by social determinants of health.        Final diagnoses:  Pyelonephritis    ED Discharge Orders          Ordered    meloxicam  (MOBIC ) 7.5 MG tablet  Daily        10/31/24 1946    cephALEXin  (KEFLEX ) 500 MG capsule  4 times daily        10/31/24 1946               Necia Kamm A, PA-C 10/31/24 1949    Dreama Longs, MD 11/01/24 1139

## 2024-10-31 NOTE — ED Triage Notes (Signed)
 Pt states that she has been having some abdominal pain and lower back pain that wraps around. States that pain transfers.
